# Patient Record
Sex: Female | Born: 1965
Health system: Southern US, Community
[De-identification: ages and names within clinical notes are randomized; demographics above are authoritative.]

## PROBLEM LIST (undated history)

## (undated) DIAGNOSIS — E785 Hyperlipidemia, unspecified: Secondary | ICD-10-CM

## (undated) DIAGNOSIS — I10 Essential (primary) hypertension: Secondary | ICD-10-CM

## (undated) DIAGNOSIS — Z9889 Other specified postprocedural states: Secondary | ICD-10-CM

## (undated) DIAGNOSIS — R112 Nausea with vomiting, unspecified: Secondary | ICD-10-CM

## (undated) DIAGNOSIS — F419 Anxiety disorder, unspecified: Secondary | ICD-10-CM

## (undated) HISTORY — PX: TONSILECTOMY, ADENOIDECTOMY, BILATERAL MYRINGOTOMY AND TUBES: SHX2538

## (undated) HISTORY — PX: ABDOMINAL HYSTERECTOMY: SHX81

## (undated) HISTORY — PX: TONSILLECTOMY: SUR1361

## (undated) HISTORY — PX: SINUS SURGERY WITH INSTATRAK: SHX5215

## (undated) HISTORY — PX: OTHER SURGICAL HISTORY: SHX169

## (undated) HISTORY — DX: Hyperlipidemia, unspecified: E78.5

---

## 1999-07-19 ENCOUNTER — Ambulatory Visit (HOSPITAL_BASED_OUTPATIENT_CLINIC_OR_DEPARTMENT_OTHER): Admission: RE | Admit: 1999-07-19 | Discharge: 1999-07-20 | Payer: Self-pay | Admitting: Otolaryngology

## 2000-07-07 ENCOUNTER — Encounter: Payer: Self-pay | Admitting: Internal Medicine

## 2000-07-07 ENCOUNTER — Ambulatory Visit (HOSPITAL_COMMUNITY): Admission: RE | Admit: 2000-07-07 | Discharge: 2000-07-07 | Payer: Self-pay | Admitting: Internal Medicine

## 2003-11-22 ENCOUNTER — Emergency Department (HOSPITAL_COMMUNITY): Admission: EM | Admit: 2003-11-22 | Discharge: 2003-11-22 | Payer: Self-pay | Admitting: Emergency Medicine

## 2004-01-14 ENCOUNTER — Emergency Department (HOSPITAL_COMMUNITY): Admission: EM | Admit: 2004-01-14 | Discharge: 2004-01-14 | Payer: Self-pay | Admitting: Emergency Medicine

## 2004-04-11 ENCOUNTER — Emergency Department (HOSPITAL_COMMUNITY): Admission: EM | Admit: 2004-04-11 | Discharge: 2004-04-11 | Payer: Self-pay | Admitting: Emergency Medicine

## 2005-07-30 ENCOUNTER — Emergency Department (HOSPITAL_COMMUNITY): Admission: EM | Admit: 2005-07-30 | Discharge: 2005-07-30 | Payer: Self-pay | Admitting: Emergency Medicine

## 2006-07-11 ENCOUNTER — Encounter (INDEPENDENT_AMBULATORY_CARE_PROVIDER_SITE_OTHER): Payer: Self-pay | Admitting: Family Medicine

## 2006-07-11 ENCOUNTER — Other Ambulatory Visit: Admission: RE | Admit: 2006-07-11 | Discharge: 2006-07-11 | Payer: Self-pay | Admitting: Family Medicine

## 2007-01-19 ENCOUNTER — Ambulatory Visit (HOSPITAL_COMMUNITY): Admission: RE | Admit: 2007-01-19 | Discharge: 2007-01-19 | Payer: Self-pay | Admitting: Family Medicine

## 2009-09-03 ENCOUNTER — Emergency Department (HOSPITAL_COMMUNITY): Admission: EM | Admit: 2009-09-03 | Discharge: 2009-09-04 | Payer: Self-pay | Admitting: Emergency Medicine

## 2009-10-09 ENCOUNTER — Ambulatory Visit (HOSPITAL_COMMUNITY): Admission: RE | Admit: 2009-10-09 | Discharge: 2009-10-09 | Payer: Self-pay | Admitting: Family Medicine

## 2009-11-18 ENCOUNTER — Ambulatory Visit: Payer: Self-pay | Admitting: Cardiology

## 2009-11-18 ENCOUNTER — Inpatient Hospital Stay (HOSPITAL_COMMUNITY): Admission: EM | Admit: 2009-11-18 | Discharge: 2009-11-20 | Payer: Self-pay | Admitting: Emergency Medicine

## 2009-11-20 ENCOUNTER — Encounter (INDEPENDENT_AMBULATORY_CARE_PROVIDER_SITE_OTHER): Payer: Self-pay | Admitting: Internal Medicine

## 2010-04-04 LAB — DIFFERENTIAL
Basophils Absolute: 0 10*3/uL (ref 0.0–0.1)
Basophils Absolute: 0.1 10*3/uL (ref 0.0–0.1)
Basophils Relative: 1 % (ref 0–1)
Basophils Relative: 1 % (ref 0–1)
Basophils Relative: 1 % (ref 0–1)
Eosinophils Relative: 0 % (ref 0–5)
Monocytes Absolute: 0.5 10*3/uL (ref 0.1–1.0)
Monocytes Absolute: 0.7 10*3/uL (ref 0.1–1.0)
Monocytes Relative: 7 % (ref 3–12)
Monocytes Relative: 9 % (ref 3–12)
Neutro Abs: 2.1 10*3/uL (ref 1.7–7.7)
Neutro Abs: 4 10*3/uL (ref 1.7–7.7)
Neutro Abs: 5.9 10*3/uL (ref 1.7–7.7)
Neutrophils Relative %: 43 % (ref 43–77)

## 2010-04-04 LAB — LIPASE, BLOOD: Lipase: 24 U/L (ref 11–59)

## 2010-04-04 LAB — POCT CARDIAC MARKERS
CKMB, poc: <1 ng/mL — ABNORMAL LOW (ref 1.0–8.0)
Troponin i, poc: <0.05 ng/mL (ref 0.00–0.09)

## 2010-04-04 LAB — CARDIAC PANEL(CRET KIN+CKTOT+MB+TROPI)
CK, MB: 0.8 ng/mL (ref 0.3–4.0)
CK, MB: 0.9 ng/mL (ref 0.3–4.0)
Relative Index: 0.8 (ref 0.0–2.5)
Relative Index: 0.8 (ref 0.0–2.5)
Total CK: 102 U/L (ref 7–177)
Total CK: 111 U/L (ref 7–177)
Troponin I: 0.02 ng/mL (ref 0.00–0.06)

## 2010-04-04 LAB — CBC
HCT: 39.8 % (ref 36.0–46.0)
Hemoglobin: 10.4 g/dL — ABNORMAL LOW (ref 12.0–15.0)
Hemoglobin: 13.3 g/dL (ref 12.0–15.0)
MCH: 29.1 pg (ref 26.0–34.0)
MCHC: 33.5 g/dL (ref 30.0–36.0)
MCV: 87.2 fL (ref 78.0–100.0)
MCV: 87.8 fL (ref 78.0–100.0)
Platelets: 211 10*3/uL (ref 150–400)
Platelets: 274 10*3/uL (ref 150–400)
RBC: 4.57 MIL/uL (ref 3.87–5.11)
RDW: 14.8 % (ref 11.5–15.5)
WBC: 4.8 10*3/uL (ref 4.0–10.5)
WBC: 6.4 10*3/uL (ref 4.0–10.5)
WBC: 8.7 10*3/uL (ref 4.0–10.5)

## 2010-04-04 LAB — D-DIMER, QUANTITATIVE: D-Dimer, Quant: 0.75 ug/mL-FEU — ABNORMAL HIGH (ref 0.00–0.48)

## 2010-04-04 LAB — COMPREHENSIVE METABOLIC PANEL
Albumin: 3.1 g/dL — ABNORMAL LOW (ref 3.5–5.2)
Albumin: 4.1 g/dL (ref 3.5–5.2)
Alkaline Phosphatase: 27 U/L — ABNORMAL LOW (ref 39–117)
Alkaline Phosphatase: 37 U/L — ABNORMAL LOW (ref 39–117)
BUN: 11 mg/dL (ref 6–23)
BUN: 17 mg/dL (ref 6–23)
CO2: 21 mEq/L (ref 19–32)
Chloride: 102 mEq/L (ref 96–112)
GFR calc non Af Amer: 32 mL/min — ABNORMAL LOW (ref >60)
Potassium: 3 mEq/L — ABNORMAL LOW (ref 3.5–5.1)
Potassium: 3.4 mEq/L — ABNORMAL LOW (ref 3.5–5.1)
Sodium: 138 mEq/L (ref 135–145)
Total Bilirubin: 0.8 mg/dL (ref 0.3–1.2)
Total Protein: 5.5 g/dL — ABNORMAL LOW (ref 6.0–8.3)

## 2010-04-04 LAB — RAPID URINE DRUG SCREEN, HOSP PERFORMED
Amphetamines: NOT DETECTED
Barbiturates: NOT DETECTED
Benzodiazepines: POSITIVE — AB
Cocaine: NOT DETECTED
Opiates: NOT DETECTED

## 2010-04-04 LAB — MAGNESIUM: Magnesium: 1.8 mg/dL (ref 1.5–2.5)

## 2010-04-04 LAB — BASIC METABOLIC PANEL
Calcium: 8.1 mg/dL — ABNORMAL LOW (ref 8.4–10.5)
GFR calc Af Amer: >60 mL/min (ref >60)
GFR calc non Af Amer: 51 mL/min — ABNORMAL LOW (ref >60)
Glucose, Bld: 82 mg/dL (ref 70–99)
Sodium: 141 mEq/L (ref 135–145)

## 2010-04-04 LAB — CORTISOL: Cortisol, Plasma: 5.1 ug/dL

## 2010-04-04 LAB — URINALYSIS, ROUTINE W REFLEX MICROSCOPIC
Bilirubin Urine: NEGATIVE
Ketones, ur: NEGATIVE mg/dL
Ketones, ur: NEGATIVE mg/dL
Nitrite: NEGATIVE
Protein, ur: NEGATIVE mg/dL
Protein, ur: NEGATIVE mg/dL
Urobilinogen, UA: 0.2 mg/dL (ref 0.0–1.0)
Urobilinogen, UA: 0.2 mg/dL (ref 0.0–1.0)

## 2010-04-04 LAB — PHOSPHORUS: Phosphorus: 3 mg/dL (ref 2.3–4.6)

## 2010-04-04 LAB — URINE MICROSCOPIC-ADD ON

## 2010-04-04 LAB — TSH: TSH: 2.205 u[IU]/mL (ref 0.350–4.500)

## 2010-10-22 ENCOUNTER — Other Ambulatory Visit (HOSPITAL_COMMUNITY): Payer: Self-pay | Admitting: Family Medicine

## 2010-10-22 DIAGNOSIS — Z139 Encounter for screening, unspecified: Secondary | ICD-10-CM

## 2010-10-29 ENCOUNTER — Ambulatory Visit (HOSPITAL_COMMUNITY)
Admission: RE | Admit: 2010-10-29 | Discharge: 2010-10-29 | Disposition: A | Discharge status: Home / Self Care | Payer: 59 | Source: Ambulatory Visit | Attending: Family Medicine | Admitting: Family Medicine

## 2010-10-29 DIAGNOSIS — Z139 Encounter for screening, unspecified: Secondary | ICD-10-CM

## 2010-10-29 DIAGNOSIS — Z1231 Encounter for screening mammogram for malignant neoplasm of breast: Secondary | ICD-10-CM | POA: Insufficient documentation

## 2011-01-22 IMAGING — CR DG ABDOMEN ACUTE W/ 1V CHEST
3 series · 3 of 3 positions shown · non-contrast
Comparison: None

CLINICAL DATA: Vomiting.  Nausea.  Dizziness.

ACUTE ABDOMEN SERIES (ABDOMEN 2 VIEW & CHEST 1 VIEW)

[view not recorded (1 of 3)]
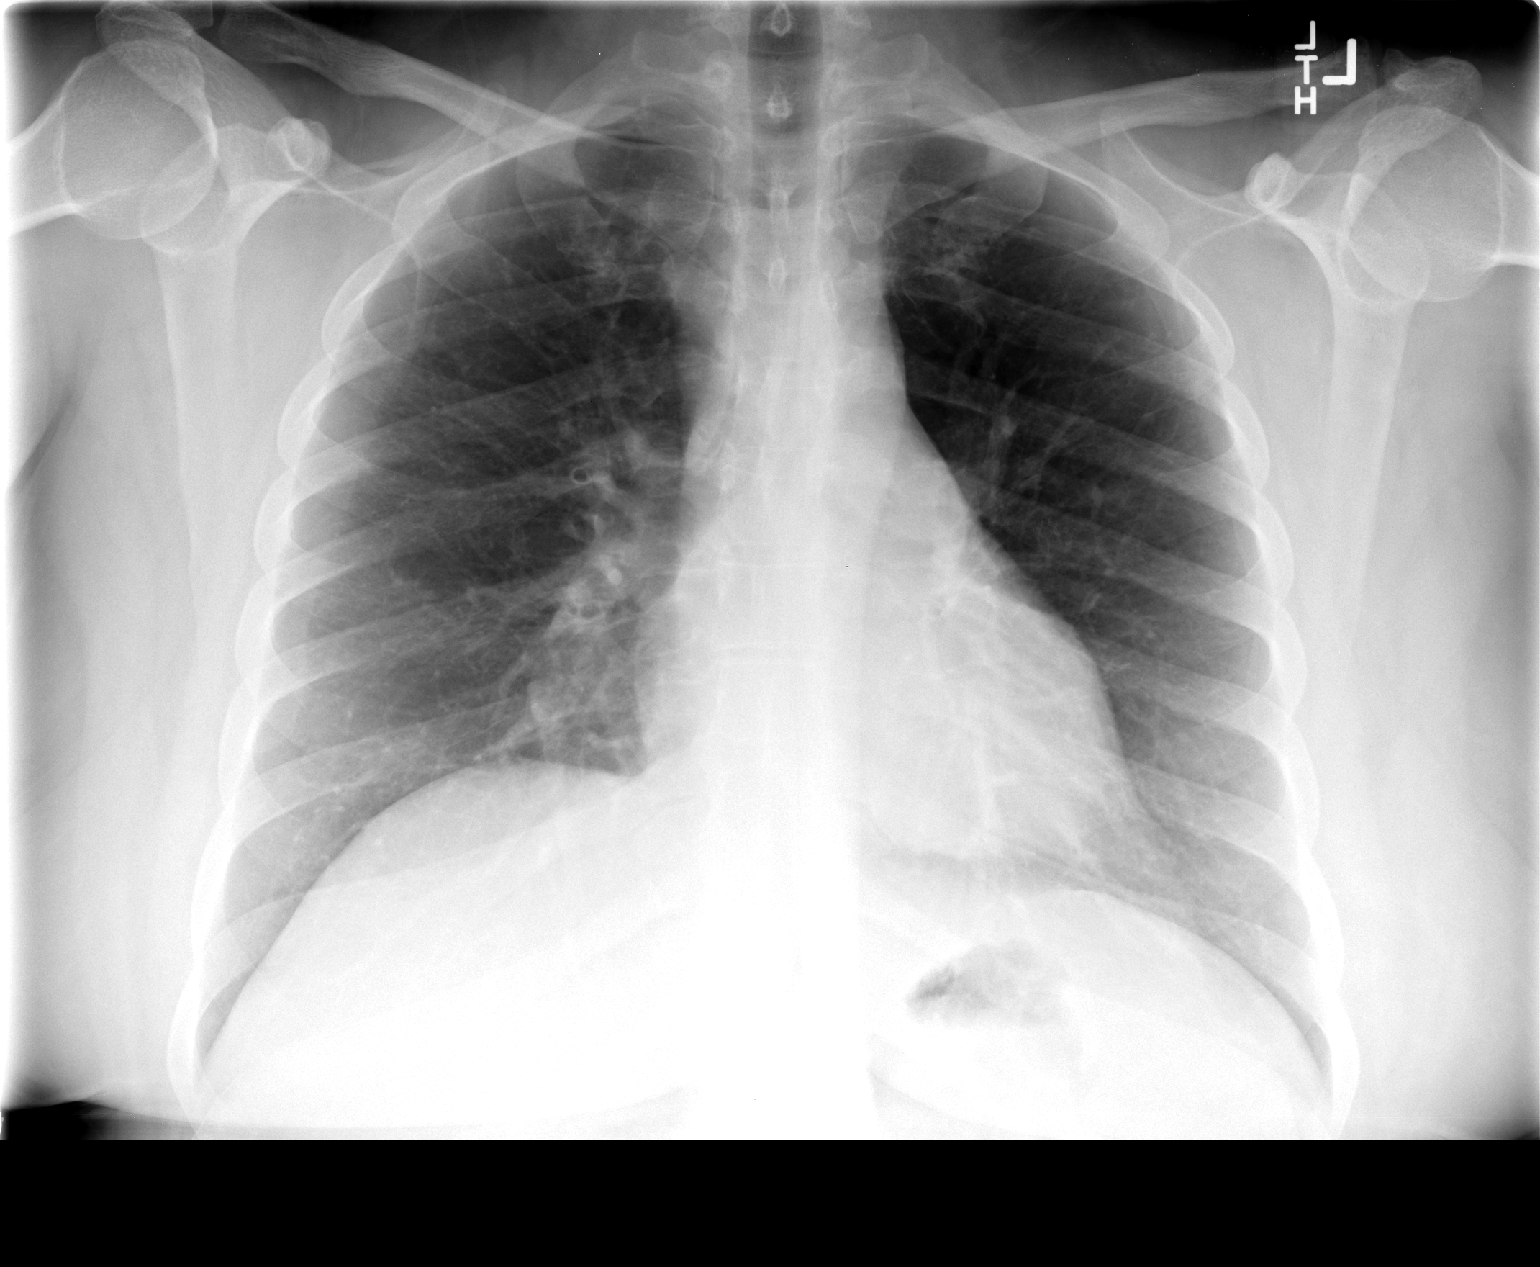

[view not recorded (2 of 3)]
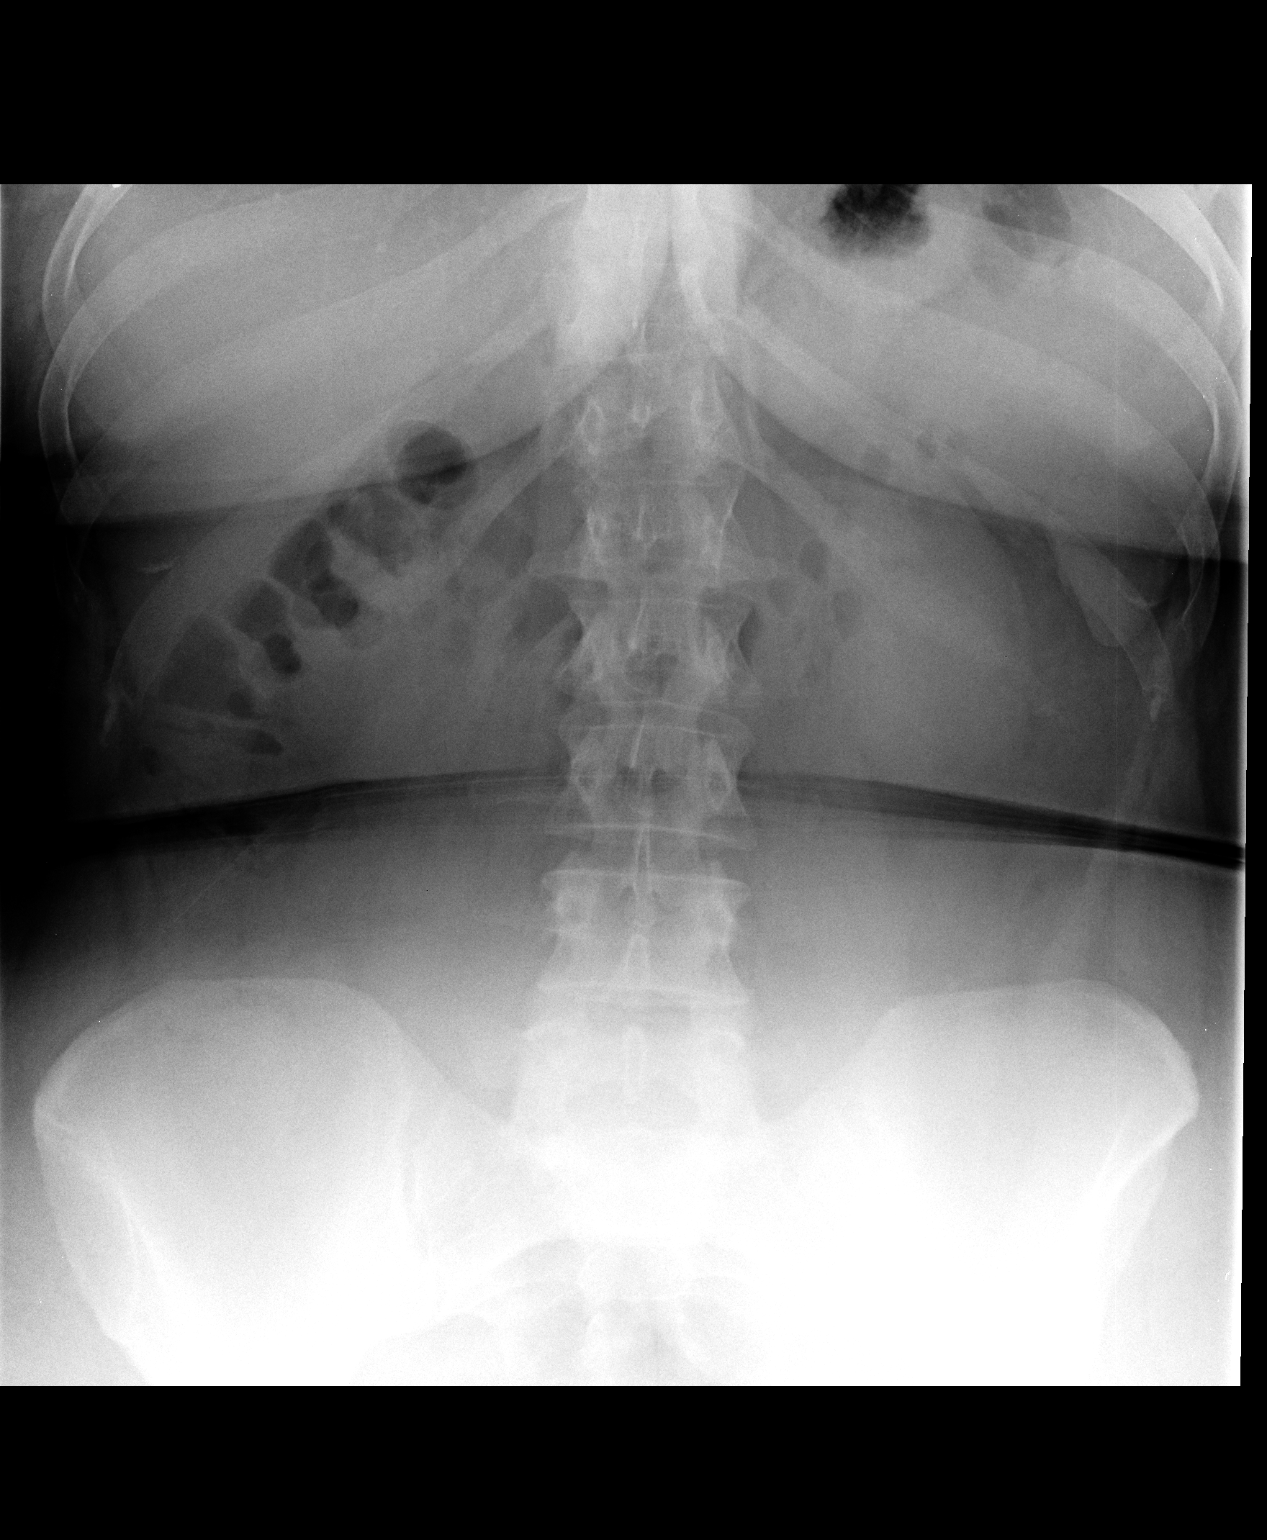

[view not recorded (3 of 3)]
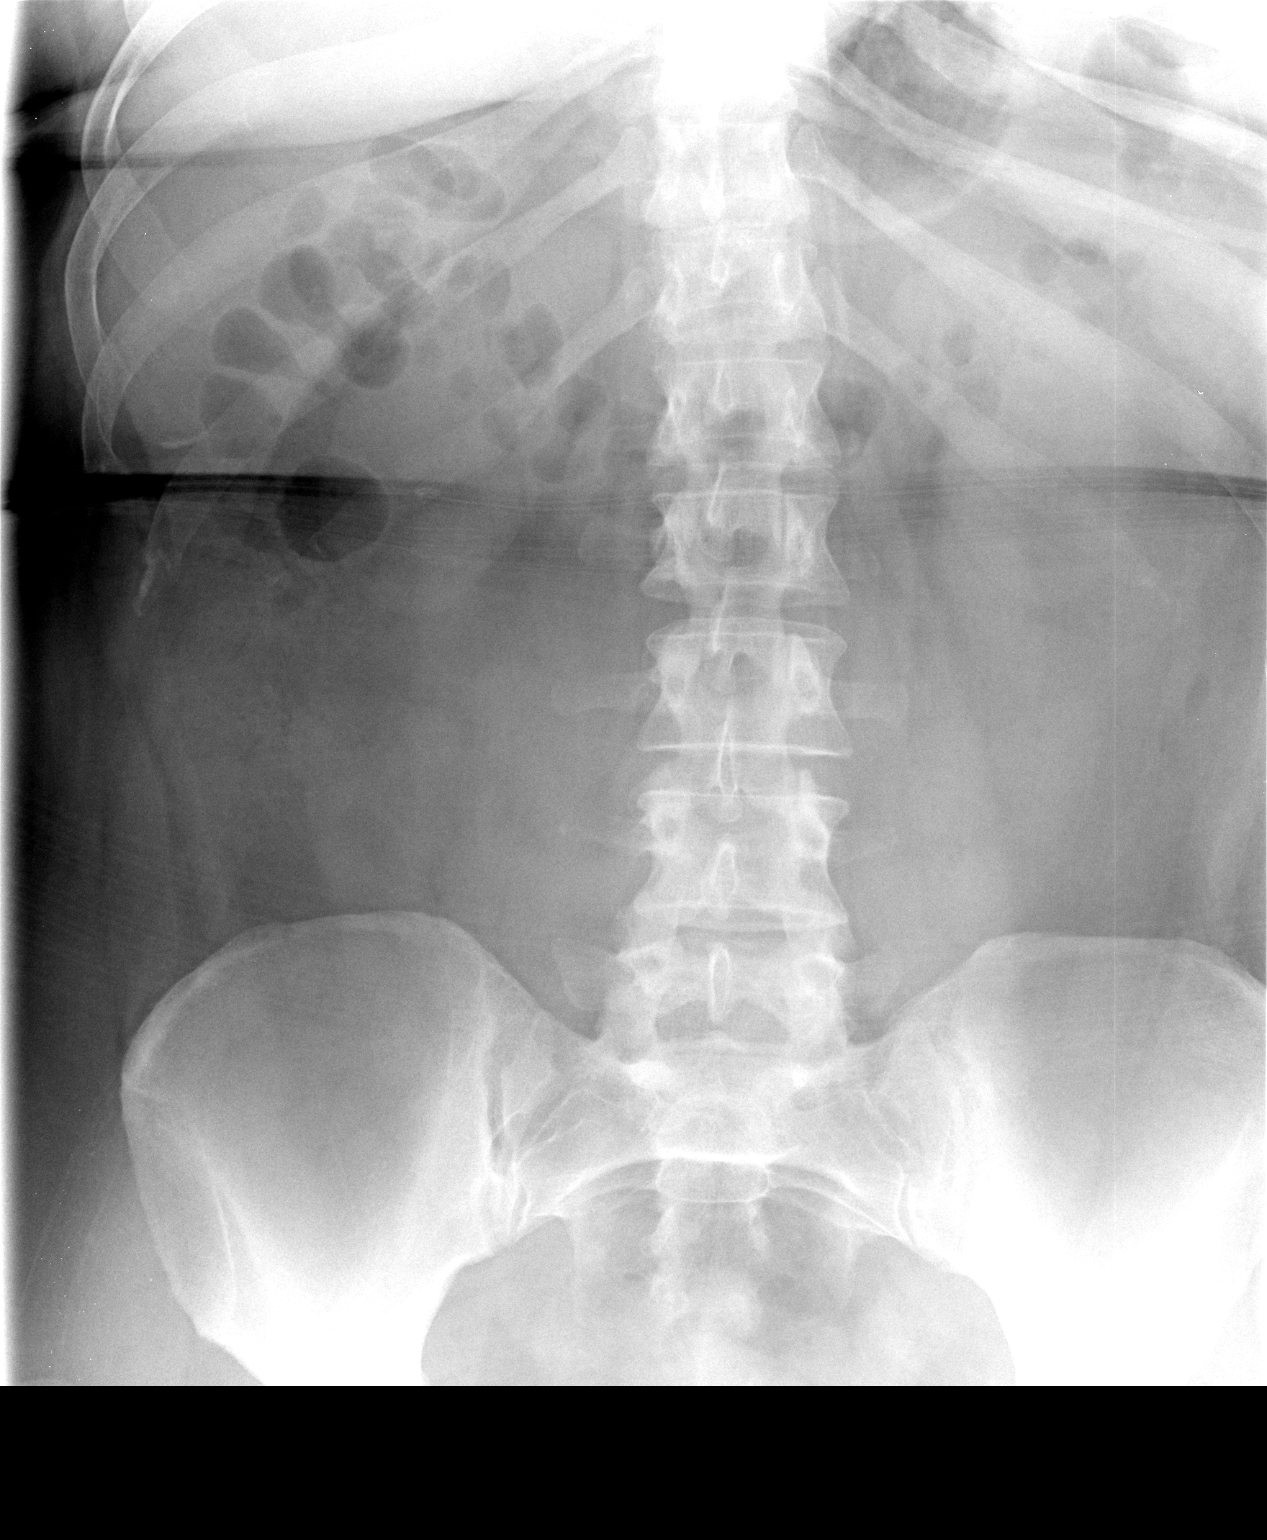

[3 of 3 positions shown; findings below may reference images not displayed]

FINDINGS: Bowel gas pattern is normal without evidence of ileus,
obstruction or free air.  No worrisome calcifications or bony
findings.

One-view chest shows normal heart and mediastinal shadows.  The
lungs are clear.  No effusions.  No free air.
IMPRESSION: Negative acute abdominal series.

## 2011-04-22 ENCOUNTER — Emergency Department (HOSPITAL_COMMUNITY)
Admission: EM | Admit: 2011-04-22 | Discharge: 2011-04-22 | Disposition: A | Discharge status: Home / Self Care | Payer: 59 | Attending: Emergency Medicine | Admitting: Emergency Medicine

## 2011-04-22 ENCOUNTER — Encounter (HOSPITAL_COMMUNITY): Payer: Self-pay

## 2011-04-22 DIAGNOSIS — H53149 Visual discomfort, unspecified: Secondary | ICD-10-CM | POA: Insufficient documentation

## 2011-04-22 DIAGNOSIS — R110 Nausea: Secondary | ICD-10-CM | POA: Insufficient documentation

## 2011-04-22 DIAGNOSIS — R51 Headache: Secondary | ICD-10-CM

## 2011-04-22 HISTORY — DX: Essential (primary) hypertension: I10

## 2011-04-22 MED ORDER — KETOROLAC TROMETHAMINE 30 MG/ML IJ SOLN
30.0000 mg | Freq: Once | INTRAMUSCULAR | Status: AC
  Administered 2011-04-22: 30 mg via INTRAVENOUS
  Filled 2011-04-22: qty 1

## 2011-04-22 MED ORDER — METOCLOPRAMIDE HCL 5 MG/ML IJ SOLN
10.0000 mg | Freq: Once | INTRAMUSCULAR | Status: AC
  Administered 2011-04-22: 10 mg via INTRAVENOUS
  Filled 2011-04-22: qty 2

## 2011-04-22 NOTE — ED Provider Notes (Signed)
History  This chart was scribed for Benny Lennert, MD by Bennett Scrape. This patient was seen in room [facility]19/[facility]19 and the patient's care was started at 9:40PM.  CSN: [id number]  Arrival date & time 04/22/11  4098   First MD Initiated Contact with Patient 04/22/11 2057      Chief Complaint  Patient presents with  . Migraine    Patient is a 46 y.o. female presenting with migraine. The history is provided by the patient. No language interpreter was used.  Migraine This is a new problem. The current episode started more than 2 days ago. The problem occurs constantly. The problem has not changed since onset.Associated symptoms include headaches. Pertinent negatives include no chest pain, no abdominal pain and no shortness of breath.    Brittany Daniels is a 46 y.o. female with a h/o migraine who presents to the Emergency Department complaining of 3 days of gradual onset, gradually worsening, constant HA that pt states is a migraine. The HA is located along the right temple with radiation into the back of the neck. The pain is described as a pulsing sensation. She reports that the symptoms started 3 days ago but didn't reach full effect until this morning. She lists photophobia and nausea as associated symptoms. She reports taking tylenol, ib profen and hydrocodone for her symptoms with no improvement. She denies blurred vision, fever, chills and emesis as associated symptoms. Pt also has a h/o HTN. She denies smoking and alcohol use.  Pt's PCP is Dr. Genia Hotter.   Past Medical History  Diagnosis Date  . Hypertension   . Migraine     History reviewed. No pertinent past surgical history.  No family history on file.  History  Substance Use Topics  . Smoking status: Never Smoker   . Smokeless tobacco: Not on file  . Alcohol Use: No    Review of Systems  Constitutional: Negative for fatigue.  HENT: Negative for congestion, sinus pressure and ear discharge.   Eyes: Positive for  photophobia. Negative for discharge.  Respiratory: Negative for cough and shortness of breath.   Cardiovascular: Negative for chest pain.  Gastrointestinal: Positive for nausea. Negative for vomiting, abdominal pain and diarrhea.  Genitourinary: Negative for frequency and hematuria.  Musculoskeletal: Negative for back pain.  Skin: Negative for rash.  Neurological: Positive for headaches. Negative for seizures.  Hematological: Negative.   Psychiatric/Behavioral: Negative for hallucinations.    Allergies  Oxycontin  Home Medications   Current Outpatient Rx  Name Route Sig Dispense Refill  . ACETAMINOPHEN 500 MG PO TABS Oral Take 500 mg by mouth every 6 (six) hours as needed.    Marland Kitchen VITAMIN D 1000 UNITS PO TABS Oral Take 1,000 Units by mouth daily.    . OMEGA-3 FATTY ACIDS 1000 MG PO CAPS Oral Take 2 g by mouth daily.    . IBUPROFEN 200 MG PO TABS Oral Take 400 mg by mouth every 6 (six) hours as needed.    Marland Kitchen OLMESARTAN MEDOXOMIL-HCTZ 20-12.5 MG PO TABS Oral Take 1 tablet by mouth daily.      Triage Vitals: BP 133/96  Pulse 105  Temp(Src) 99 F (37.2 C) (Oral)  Resp 16  Ht 5\' 4"  (1.626 m)  Wt 235 lb (106.595 kg)  BMI 40.34 kg/m2  SpO2 100%  LMP 04/17/2011  Physical Exam  Nursing note and vitals reviewed. Constitutional: She is oriented to person, place, and time. She appears well-developed.  HENT:  Head: Normocephalic and atraumatic.  Eyes: Conjunctivae  and EOM are normal. No scleral icterus.  Neck: Neck supple. No thyromegaly present.  Cardiovascular: Normal rate and regular rhythm.  Exam reveals no gallop and no friction rub.   No murmur heard. Pulmonary/Chest: No stridor. She has no wheezes. She has no rales. She exhibits no tenderness.  Abdominal: She exhibits no distension. There is no tenderness. There is no rebound.  Musculoskeletal: Normal range of motion. She exhibits no edema.  Lymphadenopathy:    She has no cervical adenopathy.  Neurological: She is oriented to  person, place, and time. Coordination normal.  Skin: No rash noted. No erythema.  Psychiatric: She has a normal mood and affect. Her behavior is normal.    ED Course  Procedures (including critical care time)  DIAGNOSTIC STUDIES: Oxygen Saturation is 100% on room air, normal by my interpretation.    COORDINATION OF CARE: 9:42PM-Pt feels better after medication and is ready to go home. Advised pt to follow up with pt if symptoms return.   Labs Reviewed - No data to display No results found.   No diagnosis found.    MDM        The chart was scribed for me under my direct supervision.  I personally performed the history, physical, and medical decision making and all procedures in the evaluation of this patient.Benny Lennert, MD 04/22/11 [phone]

## 2011-04-22 NOTE — ED Notes (Signed)
Hx of migraines, this one started approx 3 days ago with photophobia and nausea, no vomiting

## 2011-04-22 NOTE — Discharge Instructions (Signed)
Follow up with your md as needed °

## 2011-11-11 ENCOUNTER — Other Ambulatory Visit (HOSPITAL_COMMUNITY): Payer: Self-pay | Admitting: Family Medicine

## 2011-11-11 DIAGNOSIS — Z139 Encounter for screening, unspecified: Secondary | ICD-10-CM

## 2011-11-18 ENCOUNTER — Ambulatory Visit (HOSPITAL_COMMUNITY)
Admission: RE | Admit: 2011-11-18 | Discharge: 2011-11-18 | Disposition: A | Discharge status: Home / Self Care | Payer: 59 | Source: Ambulatory Visit | Attending: Family Medicine | Admitting: Family Medicine

## 2011-11-18 DIAGNOSIS — Z1231 Encounter for screening mammogram for malignant neoplasm of breast: Secondary | ICD-10-CM | POA: Insufficient documentation

## 2011-11-18 DIAGNOSIS — Z139 Encounter for screening, unspecified: Secondary | ICD-10-CM

## 2012-04-30 ENCOUNTER — Encounter: Payer: Self-pay | Admitting: *Deleted

## 2012-04-30 DIAGNOSIS — E785 Hyperlipidemia, unspecified: Secondary | ICD-10-CM

## 2012-04-30 DIAGNOSIS — F419 Anxiety disorder, unspecified: Secondary | ICD-10-CM

## 2012-04-30 DIAGNOSIS — O1002 Pre-existing essential hypertension complicating childbirth: Secondary | ICD-10-CM

## 2012-05-14 ENCOUNTER — Encounter: Payer: Self-pay | Admitting: Obstetrics & Gynecology

## 2012-05-14 ENCOUNTER — Ambulatory Visit (INDEPENDENT_AMBULATORY_CARE_PROVIDER_SITE_OTHER): Payer: 59 | Admitting: Obstetrics & Gynecology

## 2012-05-14 VITALS — BP 124/88 | Ht 64.0 in | Wt 214.0 lb

## 2012-05-14 DIAGNOSIS — N920 Excessive and frequent menstruation with regular cycle: Secondary | ICD-10-CM

## 2012-05-14 DIAGNOSIS — N946 Dysmenorrhea, unspecified: Secondary | ICD-10-CM

## 2012-05-14 MED ORDER — MEGESTROL ACETATE 40 MG PO TABS: 40.0000 mg | ORAL_TABLET | Freq: Every day | ORAL | Status: DC

## 2012-05-14 NOTE — Progress Notes (Signed)
Patient ID: Brittany Daniels, female   DOB: 05/09/1965, 47 y.o.   MRN: [id number] Over past 1 year bleeding has significantly increased Significant cramping calls in to work several times, never before Not evaluated No meds Blood pressure 124/88, height 5\' 4"  (1.626 m), weight 214 lb (97.07 kg), last menstrual period 04/21/2012.  Exam Vulva  nefg Vagina No blood cervix normal Pap done Uterus NSSC NT Adnexa    No masses non tender  ASSESSMENT: Menometrorrhagia Dysmenorrhea  PLAN: Megestrol pre operatively Sonogram 1 month Anticipate endometrial ablation

## 2012-05-14 NOTE — Patient Instructions (Addendum)
Menorrhagia   Menorrhagia is when a menstrual period is heavier or longer than normal.  HOME CARE   Only take medicine as told by your doctor.   Do not take aspirin 1 week before or during your period. Aspirin can make the bleeding worse.   Lay down for a while if you change your tampon or pad more than once in 2 hours. This may help lessen the bleeding.   Take any iron pills as told by your doctor. Heavy bleeding may cause you to lack iron in your body.   Eat a healthy diet and foods with iron. These foods include leafy green vegetables, meat, liver, eggs, and whole grain breads and cereals.   Eat foods that are high in vitamin C. These include oranges, orange juice, and grapefruits. Vitamin C can help your body take in more iron.   Do not try to lose weight. Wait until the heavy bleeding has stopped and your iron level is normal.  GET HELP RIGHT AWAY IF:   You get a fever.   You have trouble breathing.   You bleed even more heavily than usual and pass blood clots.   You feel dizzy, weak, or pass out (faint).   You need to change your tampon or pad more than once an hour.   You feel sick to your stomach (nauseous), throw up (vomit), or have watery poop (diarrhea).   You have problems from medicine.  MAKE SURE YOU:    Understand these instructions.   Will watch your condition.   Will get help right away if you are not doing well or get worse.  Document Released: 10/17/2007 Document Revised: 04/01/2011 Document Reviewed: 10/17/2007  [employer] Patient Information 2013 [employer].

## 2012-05-18 ENCOUNTER — Other Ambulatory Visit: Payer: Self-pay | Admitting: Obstetrics & Gynecology

## 2012-05-18 DIAGNOSIS — N949 Unspecified condition associated with female genital organs and menstrual cycle: Secondary | ICD-10-CM

## 2012-05-18 DIAGNOSIS — N938 Other specified abnormal uterine and vaginal bleeding: Secondary | ICD-10-CM

## 2012-06-04 ENCOUNTER — Encounter (HOSPITAL_COMMUNITY)
Admission: RE | Admit: 2012-06-04 | Discharge: 2012-06-04 | Disposition: A | Discharge status: Home / Self Care | Payer: 59 | Source: Ambulatory Visit | Attending: Obstetrics & Gynecology | Admitting: Obstetrics & Gynecology

## 2012-06-04 ENCOUNTER — Ambulatory Visit (INDEPENDENT_AMBULATORY_CARE_PROVIDER_SITE_OTHER): Payer: 59

## 2012-06-04 ENCOUNTER — Ambulatory Visit (INDEPENDENT_AMBULATORY_CARE_PROVIDER_SITE_OTHER): Payer: 59 | Admitting: Obstetrics & Gynecology

## 2012-06-04 ENCOUNTER — Other Ambulatory Visit: Payer: Self-pay | Admitting: Obstetrics & Gynecology

## 2012-06-04 ENCOUNTER — Encounter: Payer: Self-pay | Admitting: Obstetrics & Gynecology

## 2012-06-04 ENCOUNTER — Encounter (HOSPITAL_COMMUNITY): Payer: Self-pay

## 2012-06-04 VITALS — BP 90/60 | Wt 206.0 lb

## 2012-06-04 DIAGNOSIS — D259 Leiomyoma of uterus, unspecified: Secondary | ICD-10-CM

## 2012-06-04 DIAGNOSIS — N920 Excessive and frequent menstruation with regular cycle: Secondary | ICD-10-CM

## 2012-06-04 DIAGNOSIS — N949 Unspecified condition associated with female genital organs and menstrual cycle: Secondary | ICD-10-CM

## 2012-06-04 DIAGNOSIS — N946 Dysmenorrhea, unspecified: Secondary | ICD-10-CM

## 2012-06-04 DIAGNOSIS — N938 Other specified abnormal uterine and vaginal bleeding: Secondary | ICD-10-CM

## 2012-06-04 DIAGNOSIS — N9489 Other specified conditions associated with female genital organs and menstrual cycle: Secondary | ICD-10-CM

## 2012-06-04 DIAGNOSIS — D252 Subserosal leiomyoma of uterus: Secondary | ICD-10-CM

## 2012-06-04 HISTORY — DX: Other specified postprocedural states: Z98.890

## 2012-06-04 HISTORY — DX: Anxiety disorder, unspecified: F41.9

## 2012-06-04 HISTORY — DX: Nausea with vomiting, unspecified: R11.2

## 2012-06-04 LAB — COMPREHENSIVE METABOLIC PANEL
ALT: 13 U/L (ref 0–35)
AST: 15 U/L (ref 0–37)
Albumin: 3.9 g/dL (ref 3.5–5.2)
Alkaline Phosphatase: 34 U/L — ABNORMAL LOW (ref 39–117)
BUN: 17 mg/dL (ref 6–23)
Chloride: 102 mEq/L (ref 96–112)
Potassium: 3.8 mEq/L (ref 3.5–5.1)
Sodium: 140 mEq/L (ref 135–145)
Total Bilirubin: 1 mg/dL (ref 0.3–1.2)
Total Protein: 6.9 g/dL (ref 6.0–8.3)

## 2012-06-04 LAB — SURGICAL PCR SCREEN
MRSA, PCR: NEGATIVE
Staphylococcus aureus: POSITIVE — AB

## 2012-06-04 LAB — CBC
HCT: 40.9 % (ref 36.0–46.0)
Platelets: 297 10*3/uL (ref 150–400)
RDW: 13.8 % (ref 11.5–15.5)
WBC: 6.8 10*3/uL (ref 4.0–10.5)

## 2012-06-04 NOTE — Progress Notes (Signed)
Patient ID: Brittany Daniels, female   DOB: [birth year], 47 y.o.   MRN: [id number] Preoperative History and Physical  Brittany Daniels is a 47 y.o. G2P2. with Patient's last menstrual period was 04/21/2012. admitted for a hysteroscopy uterine curettage endometrial ablation and removal of submucosal myoma.  Responded to megace well no bleeding, does have a1,6 cm submucosal myoma which we will remove.  PMH:    Past Medical History  Diagnosis Date  . Hypertension   . Migraine   . Hyperlipidemia     PSH:     Past Surgical History  Procedure Laterality Date  . Sinus surgery with instatrak    . Tonsilectomy, adenoidectomy, bilateral myringotomy and tubes    . Molar abstraction      POb/GynH:     G2P2 OB History   Grav Para Term Preterm Abortions TAB SAB Ect Mult Living                  SH:   History  Substance Use Topics  . Smoking status: Never Smoker   . Smokeless tobacco: Not on file  . Alcohol Use: No    FH:    Family History  Problem Relation Age of Onset  . Hypertension Mother   . Diabetes Father   . Cancer Maternal Grandmother     breast cancer  . Cancer Maternal Grandfather     skin cancer     Allergies:  Allergies  Allergen Reactions  . Ace Inhibitors Cough  . Oxycodone Hcl Er Itching    Medications:      Current outpatient prescriptions:ALPRAZolam (XANAX) 0.5 MG tablet, Take 0.5 mg by mouth 3 (three) times daily., Disp: , Rfl: ;  cholecalciferol (VITAMIN D) 1000 UNITS tablet, Take 1,000 Units by mouth daily., Disp: , Rfl: ;  fish oil-omega-3 fatty acids 1000 MG capsule, Take 2 g by mouth daily., Disp: , Rfl: ;  ibuprofen (ADVIL,MOTRIN) 200 MG tablet, Take 400 mg by mouth every 6 (six) hours as needed., Disp: , Rfl:  megestrol (MEGACE) 40 MG tablet, Take 1 tablet (40 mg total) by mouth daily., Disp: 30 tablet, Rfl: 2;  olmesartan-hydrochlorothiazide (BENICAR HCT) 40-25 MG per tablet, Take 1 tablet by mouth daily., Disp: , Rfl: ;  acetaminophen (TYLENOL) 500 MG  tablet, Take 500 mg by mouth every 6 (six) hours as needed., Disp: , Rfl: ;  rOPINIRole (REQUIP) 0.5 MG tablet, Take 0.5 mg by mouth at bedtime., Disp: , Rfl:   Review of Systems:   Review of Systems  Constitutional: Negative for fever, chills, weight loss, malaise/fatigue and diaphoresis.  HENT: Negative for hearing loss, ear pain, nosebleeds, congestion, sore throat, neck pain, tinnitus and ear discharge.   Eyes: Negative for blurred vision, double vision, photophobia, pain, discharge and redness.  Respiratory: Negative for cough, hemoptysis, sputum production, shortness of breath, wheezing and stridor.   Cardiovascular: Negative for chest pain, palpitations, orthopnea, claudication, leg swelling and PND.  Gastrointestinal: negative for abdominal pain. Negative for heartburn, nausea, vomiting, diarrhea, constipation, blood in stool and melena.  Genitourinary: Negative for dysuria, urgency, frequency, hematuria and flank pain.  Musculoskeletal: Negative for myalgias, back pain, joint pain and falls.  Skin: Negative for itching and rash.  Neurological: Negative for dizziness, tingling, tremors, sensory change, speech change, focal weakness, seizures, loss of consciousness, weakness and headaches.  Endo/Heme/Allergies: Negative for environmental allergies and polydipsia. Does not bruise/bleed easily.  Psychiatric/Behavioral: Negative for depression, suicidal ideas, hallucinations, memory loss and substance abuse. The patient is not  nervous/anxious and does not have insomnia.      PHYSICAL EXAM:  Blood pressure 90/60, weight 206 lb (93.441 kg), last menstrual period 04/21/2012.    Vitals reviewed. Constitutional: She is oriented to person, place, and time. She appears well-developed and well-nourished.  HENT:  Head: Normocephalic and atraumatic.  Right Ear: External ear normal.  Left Ear: External ear normal.  Nose: Nose normal.  Mouth/Throat: Oropharynx is clear and moist.  Eyes:  Conjunctivae and EOM are normal. Pupils are equal, round, and reactive to light. Right eye exhibits no discharge. Left eye exhibits no discharge. No scleral icterus.  Neck: Normal range of motion. Neck supple. No tracheal deviation present. No thyromegaly present.  Cardiovascular: Normal rate, regular rhythm, normal heart sounds and intact distal pulses.  Exam reveals no gallop and no friction rub.   No murmur heard. Respiratory: Effort normal and breath sounds normal. No respiratory distress. She has no wheezes. She has no rales. She exhibits no tenderness.  GI: Soft. Bowel sounds are normal. She exhibits no distension and no mass. There is tenderness. There is no rebound and no guarding.  Genitourinary:       Vulva is normal without lesions Vagina is pink moist without discharge Cervix normal in appearance and pap is normal Uterus is normal size sonogram reveals multiple small myomsa Adnexa is negative with normal sized ovaries by sonogram  Musculoskeletal: Normal range of motion. She exhibits no edema and no tenderness.  Neurological: She is alert and oriented to person, place, and time. She has normal reflexes. She displays normal reflexes. No cranial nerve deficit. She exhibits normal muscle tone. Coordination normal.  Skin: Skin is warm and dry. No rash noted. No erythema. No pallor.  Psychiatric: She has a normal mood and affect. Her behavior is normal. Judgment and thought content normal.    Labs:   EKG:   Imaging Studies:     Assessment: Menometrorrhagia Dysmenorrhea Submucosal myoma Patient Active Problem List   Diagnosis Date Noted  . Hyperlipidemia 04/30/2012  . Anxiety 04/30/2012  . Benign essential hypertension with delivery 04/30/2012    Plan: Hysteroscopy uterine curettage endometrial ablation and removal of submucosal myoma by laser  Brittany Daniels 06/04/2012 2:24 PM

## 2012-06-04 NOTE — Patient Instructions (Addendum)
CEAIRA ERNSTER  06/04/2012   Your procedure is scheduled on:  06/10/2012  Report to [facility] at  850  AM.  Call this number if you have problems the morning of surgery: [phone]   Remember:   Do not eat food or drink liquids after midnight.   Take these medicines the morning of surgery with A SIP OF WATER: xanax, benicar   Do not wear jewelry, make-up or nail polish.  Do not wear lotions, powders, or perfumes.   Do not shave 48 hours prior to surgery. Men may shave face and neck.  Do not bring valuables to the hospital.  Contacts, dentures or bridgework may not be worn into surgery.  Leave suitcase in the car. After surgery it may be brought to your room.  For patients admitted to the hospital, checkout time is 11:00 AM the day of discharge.   Patients discharged the day of surgery will not be allowed to drive  home.  Name and phone number of your driver: family  Special Instructions: Shower using CHG 2 nights before surgery and the night before surgery.  If you shower the day of surgery use CHG.  Use special wash - you have one bottle of CHG for all showers.  You should use approximately 1/3 of the bottle for each shower.   Please read over the following fact sheets that you were given: Pain Booklet, Coughing and Deep Breathing, MRSA Information, Surgical Site Infection Prevention, Anesthesia Post-op Instructions and Care and Recovery After Surgery Endometrial Ablation Endometrial ablation removes the lining of the uterus (endometrium). It is usually a same day, outpatient treatment. Ablation helps avoid major surgery (such as a hysterectomy). A hysterectomy is removal of the cervix and uterus. Endometrial ablation has less risk and complications, has a shorter recovery period and is less expensive. After endometrial ablation, most women will have little or no menstrual bleeding. You may not keep your fertility. Pregnancy is no longer likely after this procedure but if you are  pre-menopausal, you still need to use a reliable method of birth control following the procedure because pregnancy can occur. REASONS TO HAVE THE PROCEDURE MAY INCLUDE:  Heavy periods.  Bleeding that is causing anemia.  Anovulatory bleeding, very irregular, bleeding.  Bleeding submucous fibroids (on the lining inside the uterus) if they are smaller than 3 centimeters. REASONS NOT TO HAVE THE PROCEDURE MAY INCLUDE:  You wish to have more children.  You have a pre-cancerous or cancerous problem. The cause of any abnormal bleeding must be diagnosed before having the procedure.  You have pain coming from the uterus.  You have a submucus fibroid larger than 3 centimeters.  You recently had a baby.  You recently had an infection in the uterus.  You have a severe retro-flexed, tipped uterus and cannot insert the instrument to do the ablation.  You had a Cesarean section or deep major surgery on the uterus.  The inner cavity of the uterus is too large for the endometrial ablation instrument. RISKS AND COMPLICATIONS   Perforation of the uterus.  Bleeding.  Infection of the uterus, bladder or vagina.  Injury to surrounding organs.  Cutting the cervix.  An air bubble to the lung (air embolus).  Pregnancy following the procedure.  Failure of the procedure to help the problem requiring hysterectomy.  Decreased ability to diagnose cancer in the lining of the uterus. BEFORE THE PROCEDURE  The lining of the uterus must be tested  to make sure there is no pre-cancerous or cancer cells present.  Medications may be given to make the lining of the uterus thinner.  Ultrasound may be used to evaluate the size and look for abnormalities of the uterus.  Future pregnancy is not desired. PROCEDURE  There are different ways to destroy the lining of the uterus.   Resectoscope - radio frequency-alternating electric current is the most common one used.  Cryotherapy - freezing the  lining of the uterus.  Heated Free Liquid - heated salt (saline) solution inserted into the uterus.  Microwave - uses high energy microwaves in the uterus.  Thermal Balloon - a catheter with a balloon tip is inserted into the uterus and filled with heated fluid. Your caregiver will talk with you about the method used in this clinic. They will also instruct you on the pros and cons of the procedure. Endometrial ablation is performed along with a procedure called operative hysteroscopy. A narrow viewing tube is inserted through the birth canal (vagina) and through the cervix into the uterus. A tiny camera attached to the viewing tube (hysteroscope) allows the uterine cavity to be shown on a TV monitor during surgery. Your uterus is filled with a harmless liquid to make the procedure easier. The lining of the uterus is then removed. The lining can also be removed with a resectoscope which allows your surgeon to cut away the lining of the uterus under direct vision. Usually, you will be able to go home within an hour after the procedure. HOME CARE INSTRUCTIONS   Do not drive for 24 hours.  No tampons, douching or intercourse for 2 weeks or until your caregiver approves.  Rest at home for 24 to 48 hours. You may then resume normal activities unless told differently by your caregiver.  Take your temperature two times a day for 4 days, and record it.  Take any medications your caregiver has ordered, as directed.  Use some form of contraception if you are pre-menopausal and do not want to get pregnant. Bleeding after the procedure is normal. It varies from light spotting and mildly watery to bloody discharge for 4 to 6 weeks. You may also have mild cramping. Only take over-the-counter or prescription medicines for pain, discomfort, or fever as directed by your caregiver. Do not use aspirin, as this may aggravate bleeding. Frequent urination during the first 24 hours is normal. You will not know how  effective your surgery is until at least 3 months after the surgery. SEEK IMMEDIATE MEDICAL CARE IF:   Bleeding is heavier than a normal menstrual cycle.  An oral temperature above 102 F (38.9 C) develops.  You have increasing cramps or pains not relieved with medication or develop belly (abdominal) pain which does not seem to be related to the same area of earlier cramping and pain.  You are light headed, weak or have fainting episodes.  You develop pain in the shoulder strap areas.  You have chest or leg pain.  You have abnormal vaginal discharge.  You have painful urination. Document Released: 11/17/2003 Document Revised: 04/01/2011 Document Reviewed: 02/14/2007 [facility] Patient Information 2013 [place], [place]. Hysteroscopy Hysteroscopy is a procedure used for looking inside the womb (uterus). It may be done for many different reasons, including:  To evaluate abnormal bleeding, fibroid (benign, noncancerous) tumors, polyps, scar tissue (adhesions), and possibly cancer of the uterus.  To look for lumps (tumors) and other uterine growths.  To look for causes of why a woman cannot get pregnant (  infertility), causes of recurrent loss of pregnancy (miscarriages), or a lost intrauterine device (IUD).  To perform a sterilization by blocking the fallopian tubes from inside the uterus. A hysteroscopy should be done right after a menstrual period to be sure you are not pregnant. LET YOUR CAREGIVER KNOW ABOUT:   Allergies.  Medicines taken, including herbs, eyedrops, over-the-counter medicines, and creams.  Use of steroids (by mouth or creams).  Previous problems with anesthetics or numbing medicines.  History of bleeding or blood problems.  History of blood clots.  Possibility of pregnancy, if this applies.  Previous surgery.  Other health problems. RISKS AND COMPLICATIONS   Putting a hole in the uterus.  Excessive bleeding.  Infection.  Damage to the  cervix.  Injury to other organs.  Allergic reaction to medicines.  Too much fluid used in the uterus for the procedure. BEFORE THE PROCEDURE   Do not take aspirin or blood thinners for a week before the procedure, or as directed. It can cause bleeding.  Arrive at least 60 minutes before the procedure or as directed to read and sign the necessary forms.  Arrange for someone to take you home after the procedure.  If you smoke, do not smoke for 2 weeks before the procedure. PROCEDURE   Your caregiver may give you medicine to relax you. He or she may also give you a medicine that numbs the area around the cervix (local anesthetic) or a medicine that makes you sleep (general anesthesia).  Sometimes, a medicine is placed in the cervix the day before the procedure. This medicine makes the cervix have a larger opening (dilate). This makes it easier for the instrument to be inserted into the uterus.  A small instrument (hysteroscope) is inserted through the vagina into the uterus. This instrument is similar to a pencil-sized telescope with a light.  During the procedure, air or a liquid is put into the uterus, which allows the surgeon to see better.  Sometimes, tissue is gently scraped from inside the uterus. These tissue samples are sent to a specialist who looks at tissue samples (pathologist). The pathologist will give a report to your caregiver. This will help your caregiver decide if further treatment is necessary. The report will also help your caregiver decide on the best treatment if the test comes back abnormal. AFTER THE PROCEDURE   If you had a general anesthetic, you may be groggy for a couple hours after the procedure.  If you had a local anesthetic, you will be advised to rest at the surgical center or caregiver's office until you are stable and feel ready to go home.  You may have some cramping for a couple days.  You may have bleeding, which varies from light spotting for a  few days to menstrual-like bleeding for up to 3 to 7 days. This is normal.  Have someone take you home. FINDING OUT THE RESULTS OF YOUR TEST Not all test results are available during your visit. If your test results are not back during the visit, make an appointment with your caregiver to find out the results. Do not assume everything is normal if you have not heard from your caregiver or the medical facility. It is important for you to follow up on all of your test results. HOME CARE INSTRUCTIONS   Do not drive for 24 hours or as instructed.  Only take over-the-counter or prescription medicines for pain, discomfort, or fever as directed by your caregiver.  Do not take aspirin. It  can cause or aggravate bleeding.  Do not drive or drink alcohol while taking pain medicine.  You may resume your usual diet.  Do not use tampons, douche, or have sexual intercourse for 2 weeks, or as advised by your caregiver.  Rest and sleep for the first 24 to 48 hours.  Take your temperature twice a day for 4 to 5 days. Write it down. Give these temperatures to your caregiver if they are abnormal (above 98.6 F or 37.0 C).  Take medicines your caregiver has ordered as directed.  Follow your caregiver's advice regarding diet, exercise, lifting, driving, and general activities.  Take showers instead of baths for 2 weeks, or as recommended by your caregiver.  If you develop constipation:  Take a mild laxative with the advice of your caregiver.  Eat bran foods.  Drink enough water and fluids to keep your urine clear or pale yellow.  Try to have someone with you or available to you for the first 24 to 48 hours, especially if you had a general anesthetic.  Make sure you and your family understand everything about your operation and recovery.  Follow your caregiver's advice regarding follow-up appointments and Pap smears. SEEK MEDICAL CARE IF:   You feel dizzy or lightheaded.  You feel sick to  your stomach (nauseous).  You develop abnormal vaginal discharge.  You develop a rash.  You have an abnormal reaction or allergy to your medicine.  You need stronger pain medicine. SEEK IMMEDIATE MEDICAL CARE IF:   Bleeding is heavier than a normal menstrual period or you have blood clots.  You have an oral temperature above 102 F (38.9 C), not controlled by medicine.  You have increasing cramps or pains not relieved with medicine.  You develop belly (abdominal) pain that does not seem to be related to the same area of earlier cramping and pain.  You pass out.  You develop pain in the tops of your shoulders (shoulder strap areas).  You develop shortness of breath. MAKE SURE YOU:   Understand these instructions.  Will watch your condition.  Will get help right away if you are not doing well or get worse. Document Released: 04/15/2000 Document Revised: 04/01/2011 Document Reviewed: 08/08/2008 [facility] Patient Information 2013 [place], [place]. PATIENT INSTRUCTIONS POST-ANESTHESIA  IMMEDIATELY FOLLOWING SURGERY:  Do not drive or operate machinery for the first twenty four hours after surgery.  Do not make any important decisions for twenty four hours after surgery or while taking narcotic pain medications or sedatives.  If you develop intractable nausea and vomiting or a severe headache please notify your doctor immediately.  FOLLOW-UP:  Please make an appointment with your surgeon as instructed. You do not need to follow up with anesthesia unless specifically instructed to do so.  WOUND CARE INSTRUCTIONS (if applicable):  Keep a dry clean dressing on the anesthesia/puncture wound site if there is drainage.  Once the wound has quit draining you may leave it open to air.  Generally you should leave the bandage intact for twenty four hours unless there is drainage.  If the epidural site drains for more than 36-48 hours please call the anesthesia department.  QUESTIONS?:  Please  feel free to call your physician or the hospital operator if you have any questions, and they will be happy to assist you.

## 2012-06-04 NOTE — Patient Instructions (Addendum)

## 2012-06-08 ENCOUNTER — Encounter (HOSPITAL_COMMUNITY): Payer: Self-pay | Admitting: Pharmacy Technician

## 2012-06-10 ENCOUNTER — Ambulatory Visit (HOSPITAL_COMMUNITY): Payer: 59 | Admitting: Anesthesiology

## 2012-06-10 ENCOUNTER — Other Ambulatory Visit: Payer: 59

## 2012-06-10 ENCOUNTER — Encounter (HOSPITAL_COMMUNITY): Payer: Self-pay | Admitting: *Deleted

## 2012-06-10 ENCOUNTER — Ambulatory Visit (HOSPITAL_COMMUNITY)
Admission: RE | Admit: 2012-06-10 | Discharge: 2012-06-10 | Disposition: A | Discharge status: Home / Self Care | Payer: 59 | Source: Ambulatory Visit | Attending: Obstetrics & Gynecology | Admitting: Obstetrics & Gynecology

## 2012-06-10 ENCOUNTER — Encounter (HOSPITAL_COMMUNITY)
Admission: RE | Disposition: A | Discharge status: Home / Self Care | Payer: Self-pay | Source: Ambulatory Visit | Attending: Obstetrics & Gynecology

## 2012-06-10 ENCOUNTER — Encounter (HOSPITAL_COMMUNITY): Payer: Self-pay | Admitting: Anesthesiology

## 2012-06-10 DIAGNOSIS — Z01810 Encounter for preprocedural cardiovascular examination: Secondary | ICD-10-CM | POA: Insufficient documentation

## 2012-06-10 DIAGNOSIS — I10 Essential (primary) hypertension: Secondary | ICD-10-CM | POA: Insufficient documentation

## 2012-06-10 DIAGNOSIS — N946 Dysmenorrhea, unspecified: Secondary | ICD-10-CM | POA: Insufficient documentation

## 2012-06-10 DIAGNOSIS — D250 Submucous leiomyoma of uterus: Secondary | ICD-10-CM

## 2012-06-10 DIAGNOSIS — N920 Excessive and frequent menstruation with regular cycle: Secondary | ICD-10-CM

## 2012-06-10 DIAGNOSIS — Z01812 Encounter for preprocedural laboratory examination: Secondary | ICD-10-CM | POA: Insufficient documentation

## 2012-06-10 DIAGNOSIS — Z9889 Other specified postprocedural states: Secondary | ICD-10-CM

## 2012-06-10 HISTORY — PX: HOLMIUM LASER APPLICATION: SHX5852

## 2012-06-10 HISTORY — PX: DILITATION & CURRETTAGE/HYSTROSCOPY WITH THERMACHOICE ABLATION: SHX5569

## 2012-06-10 LAB — URINE MICROSCOPIC-ADD ON

## 2012-06-10 LAB — URINALYSIS, ROUTINE W REFLEX MICROSCOPIC
Glucose, UA: NEGATIVE mg/dL
Specific Gravity, Urine: >1.03 — ABNORMAL HIGH (ref 1.005–1.030)
pH: 5.5 (ref 5.0–8.0)

## 2012-06-10 SURGERY — DILATATION & CURETTAGE/HYSTEROSCOPY WITH THERMACHOICE ABLATION
Anesthesia: General | Wound class: Clean Contaminated

## 2012-06-10 MED ORDER — DEXTROSE 5 % IV SOLN
INTRAVENOUS | Status: DC | PRN
  Administered 2012-06-10: 26 mL

## 2012-06-10 MED ORDER — ONDANSETRON HCL 4 MG/2ML IJ SOLN
4.0000 mg | Freq: Once | INTRAMUSCULAR | Status: AC | PRN
  Administered 2012-06-10: 4 mg via INTRAVENOUS

## 2012-06-10 MED ORDER — FENTANYL CITRATE 0.05 MG/ML IJ SOLN
INTRAMUSCULAR | Status: AC
  Filled 2012-06-10: qty 5

## 2012-06-10 MED ORDER — LIDOCAINE HCL (CARDIAC) 20 MG/ML IV SOLN
INTRAVENOUS | Status: DC | PRN
  Administered 2012-06-10: 30 mg via INTRAVENOUS

## 2012-06-10 MED ORDER — GLYCOPYRROLATE 0.2 MG/ML IJ SOLN
INTRAMUSCULAR | Status: AC
  Filled 2012-06-10: qty 1

## 2012-06-10 MED ORDER — CEFAZOLIN SODIUM-DEXTROSE 2-3 GM-% IV SOLR
2.0000 g | INTRAVENOUS | Status: AC
  Administered 2012-06-10: 2 g via INTRAVENOUS

## 2012-06-10 MED ORDER — ONDANSETRON HCL 8 MG PO TABS: 8.0000 mg | ORAL_TABLET | Freq: Three times a day (TID) | ORAL | Status: DC | PRN

## 2012-06-10 MED ORDER — ONDANSETRON HCL 4 MG/2ML IJ SOLN
INTRAMUSCULAR | Status: AC
  Filled 2012-06-10: qty 2

## 2012-06-10 MED ORDER — MIDAZOLAM HCL 2 MG/2ML IJ SOLN
1.0000 mg | INTRAMUSCULAR | Status: DC | PRN
  Administered 2012-06-10: 2 mg via INTRAVENOUS

## 2012-06-10 MED ORDER — SCOPOLAMINE 1 MG/3DAYS TD PT72
1.0000 | MEDICATED_PATCH | Freq: Once | TRANSDERMAL | Status: DC
  Administered 2012-06-10: 1.5 mg via TRANSDERMAL

## 2012-06-10 MED ORDER — DEXAMETHASONE SODIUM PHOSPHATE 4 MG/ML IJ SOLN
INTRAMUSCULAR | Status: AC
  Filled 2012-06-10: qty 1

## 2012-06-10 MED ORDER — HYDROCODONE-ACETAMINOPHEN 5-325 MG PO TABS: 1.0000 | ORAL_TABLET | Freq: Four times a day (QID) | ORAL | Status: DC | PRN

## 2012-06-10 MED ORDER — ONDANSETRON HCL 4 MG/2ML IJ SOLN
4.0000 mg | Freq: Once | INTRAMUSCULAR | Status: AC
  Administered 2012-06-10: 4 mg via INTRAVENOUS

## 2012-06-10 MED ORDER — LACTATED RINGERS IV SOLN
INTRAVENOUS | Status: DC
  Administered 2012-06-10: 08:00:00 via INTRAVENOUS

## 2012-06-10 MED ORDER — CEFAZOLIN SODIUM-DEXTROSE 2-3 GM-% IV SOLR
INTRAVENOUS | Status: AC
  Filled 2012-06-10: qty 50

## 2012-06-10 MED ORDER — PROPOFOL 10 MG/ML IV BOLUS
INTRAVENOUS | Status: DC | PRN
  Administered 2012-06-10: 150 mg via INTRAVENOUS

## 2012-06-10 MED ORDER — FENTANYL CITRATE 0.05 MG/ML IJ SOLN
INTRAMUSCULAR | Status: AC
  Filled 2012-06-10: qty 2

## 2012-06-10 MED ORDER — SODIUM CHLORIDE 0.9 % IR SOLN
Status: DC | PRN
  Administered 2012-06-10: 3000 mL

## 2012-06-10 MED ORDER — DEXAMETHASONE SODIUM PHOSPHATE 4 MG/ML IJ SOLN
4.0000 mg | Freq: Once | INTRAMUSCULAR | Status: AC
  Administered 2012-06-10: 4 mg via INTRAVENOUS

## 2012-06-10 MED ORDER — KETOROLAC TROMETHAMINE 10 MG PO TABS: 10.0000 mg | ORAL_TABLET | Freq: Three times a day (TID) | ORAL | Status: DC | PRN

## 2012-06-10 MED ORDER — KETOROLAC TROMETHAMINE 30 MG/ML IJ SOLN
30.0000 mg | Freq: Once | INTRAMUSCULAR | Status: AC
  Administered 2012-06-10: 30 mg via INTRAVENOUS

## 2012-06-10 MED ORDER — SCOPOLAMINE 1 MG/3DAYS TD PT72
MEDICATED_PATCH | TRANSDERMAL | Status: AC
  Filled 2012-06-10: qty 1

## 2012-06-10 MED ORDER — MIDAZOLAM HCL 2 MG/2ML IJ SOLN
INTRAMUSCULAR | Status: AC
  Filled 2012-06-10: qty 2

## 2012-06-10 MED ORDER — KETOROLAC TROMETHAMINE 30 MG/ML IJ SOLN
INTRAMUSCULAR | Status: AC
  Filled 2012-06-10: qty 1

## 2012-06-10 MED ORDER — FENTANYL CITRATE 0.05 MG/ML IJ SOLN
25.0000 ug | INTRAMUSCULAR | Status: DC | PRN
  Administered 2012-06-10 (×4): 50 ug via INTRAVENOUS

## 2012-06-10 MED ORDER — GLYCOPYRROLATE 0.2 MG/ML IJ SOLN
0.2000 mg | Freq: Once | INTRAMUSCULAR | Status: AC
  Administered 2012-06-10: 0.2 mg via INTRAVENOUS

## 2012-06-10 MED ORDER — FENTANYL CITRATE 0.05 MG/ML IJ SOLN
INTRAMUSCULAR | Status: DC | PRN
  Administered 2012-06-10 (×4): 25 ug via INTRAVENOUS
  Administered 2012-06-10: 50 ug via INTRAVENOUS
  Administered 2012-06-10 (×2): 25 ug via INTRAVENOUS

## 2012-06-10 SURGICAL SUPPLY — 36 items
BAG DECANTER FOR FLEXI CONT (MISCELLANEOUS) ×2 IMPLANT
BAG HAMPER (MISCELLANEOUS) ×2 IMPLANT
CATH THERMACHOICE III (CATHETERS) ×2 IMPLANT
CLOTH BEACON ORANGE TIMEOUT ST (SAFETY) ×2 IMPLANT
COVER LIGHT HANDLE STERIS (MISCELLANEOUS) ×4 IMPLANT
ELECT REM PT RETURN 9FT ADLT (ELECTROSURGICAL) ×2
ELECTRODE REM PT RTRN 9FT ADLT (ELECTROSURGICAL) ×1 IMPLANT
FORMALIN 10 PREFIL 120ML (MISCELLANEOUS) ×3 IMPLANT
GAUZE SPONGE 4X4 16PLY XRAY LF (GAUZE/BANDAGES/DRESSINGS) ×2 IMPLANT
GLOVE BIOGEL PI IND STRL 7.0 (GLOVE) IMPLANT
GLOVE BIOGEL PI IND STRL 8 (GLOVE) ×1 IMPLANT
GLOVE BIOGEL PI INDICATOR 7.0 (GLOVE) ×1
GLOVE BIOGEL PI INDICATOR 8 (GLOVE) ×1
GLOVE ECLIPSE 8.0 STRL XLNG CF (GLOVE) ×2 IMPLANT
GLOVE EXAM NITRILE MD LF STRL (GLOVE) ×2 IMPLANT
GLOVE SS BIOGEL STRL SZ 6.5 (GLOVE) IMPLANT
GLOVE SUPERSENSE BIOGEL SZ 6.5 (GLOVE) ×1
GOWN STRL REIN XL XLG (GOWN DISPOSABLE) ×5 IMPLANT
INST SET HYSTEROSCOPY (KITS) ×2 IMPLANT
IV D5W 500ML (IV SOLUTION) ×2 IMPLANT
IV NS IRRIG 3000ML ARTHROMATIC (IV SOLUTION) ×2 IMPLANT
KIT ROOM TURNOVER APOR (KITS) ×2 IMPLANT
LASER FIBER DISP 1000U (UROLOGICAL SUPPLIES) ×1 IMPLANT
MANIFOLD NEPTUNE II (INSTRUMENTS) ×2 IMPLANT
MARKER SKIN DUAL TIP RULER LAB (MISCELLANEOUS) ×2 IMPLANT
NS IRRIG 1000ML POUR BTL (IV SOLUTION) ×2 IMPLANT
PACK BASIC III (CUSTOM PROCEDURE TRAY) ×2
PACK SRG BSC III STRL LF ECLPS (CUSTOM PROCEDURE TRAY) ×1 IMPLANT
PAD ARMBOARD 7.5X6 YLW CONV (MISCELLANEOUS) ×2 IMPLANT
PAD TELFA 3X4 1S STER (GAUZE/BANDAGES/DRESSINGS) ×3 IMPLANT
SET BASIN LINEN APH (SET/KITS/TRAYS/PACK) ×2 IMPLANT
SET IRRIG Y TYPE TUR BLADDER L (SET/KITS/TRAYS/PACK) ×2 IMPLANT
SHEET LAVH (DRAPES) ×2 IMPLANT
TOWEL OR 17X26 4PK STRL BLUE (TOWEL DISPOSABLE) ×2 IMPLANT
WATER STERILE IRR 1000ML POUR (IV SOLUTION) ×2 IMPLANT
YANKAUER SUCT BULB TIP 10FT TU (MISCELLANEOUS) ×2 IMPLANT

## 2012-06-10 NOTE — Op Note (Signed)
Preoperative diagnosis: Menometrorrhagia                                        Dysmenorrhea                                         Submucosal myoma, 1.6 cm  Postoperative diagnoses: Same as above   Procedure: Hysteroscopy, uterine curettage, endometrial ablation                    Removal of submucosal myoma with Holmium laser  Surgeon: Despina Hidden MD  Anesthesia: Laryngeal mask airway  Findings: The endometrium was normal. There were no fibroid or other abnormalities.  Description of operation: The patient was taken to the operating room and placed in the supine position. She underwent general anesthesia using the laryngeal mask airway. She was placed in the dorsal lithotomy position and prepped and draped in the usual sterile fashion. A Graves speculum was placed and the anterior cervical lip was grasped with a single-tooth tenaculum. The cervix was dilated serially to allow passage of the hysteroscope. Diagnostic hysteroscopy was performed and was found to be normal. A vigorous uterine curettage was then performed and all tissue sent to pathology for evaluation.  The submucosal myoma was identified and could not be removed with curettage or other instruments, it had a broad base.  The Holmium laser was employed and removed without difficulty.   The ThermaChoice 3 endometrial ablation balloon was then used were 26 cc of D5W was required to maintain a pressure of 190-200 mm of mercury throughout the procedure. Toatl therapy time was 10:41.  All of the equipment worked well throughout the procedure. All of the fluid was returned at the end of the procedure. The patient was awakened from anesthesia and taken to the recovery room in good stable condition all counts were correct. She received 2 g of Ancef and 30 mg of Toradol preoperatively. She will be discharged from the recovery room and followed up in the office in 1- 2 weeks.  Esteen Delpriore H 06/10/2012 10:05 AM

## 2012-06-10 NOTE — Anesthesia Procedure Notes (Signed)
Procedure Name: LMA Insertion Date/Time: 06/10/2012 9:01 AM Performed by: Glynn Octave E Pre-anesthesia Checklist: Patient identified, Patient being monitored, Emergency Drugs available, Timeout performed and Suction available Patient Re-evaluated:Patient Re-evaluated prior to inductionOxygen Delivery Method: Circle System Utilized Preoxygenation: Pre-oxygenation with 100% oxygen Intubation Type: IV induction Ventilation: Mask ventilation without difficulty LMA: LMA inserted LMA Size: 4.0 and 3.0 Number of attempts: 1 Placement Confirmation: positive ETCO2 and breath sounds checked- equal and bilateral

## 2012-06-10 NOTE — Anesthesia Postprocedure Evaluation (Signed)
  Anesthesia Post-op Note  Patient: Brittany Daniels  Procedure(s) Performed: Procedure(s): DILATATION & CURETTAGE/HYSTEROSCOPY WITH THERMACHOICE ABLATION (TOTAL THERAPY TIME=35min19sec)  (N/A) HOLMIUM LASER REMOVAL OF SUBMUCOSAL MYOMA (N/A)  Patient Location: PACU  Anesthesia Type:General  Level of Consciousness: awake, alert  and oriented  Airway and Oxygen Therapy: Patient Spontanous Breathing and Patient connected to face mask oxygen  Post-op Pain: none  Post-op Assessment: Post-op Vital signs reviewed, Patient's Cardiovascular Status Stable, Respiratory Function Stable, Patent Airway and No signs of Nausea or vomiting  Post-op Vital Signs: Reviewed and stable  Complications: No apparent anesthesia complications

## 2012-06-10 NOTE — H&P (Signed)
Preoperative History and Physical  Brittany Daniels is a 47 y.o. G2P2. with Patient's last menstrual period was 04/21/2012. admitted for a hysteroscopy uterine curettage endometrial ablation and removal of submucosal myoma.  Responded to megace well no bleeding, does have a1,6 cm submucosal myoma which we will remove.  PMH:  Past Medical History   Diagnosis  Date   .  Hypertension    .  Migraine    .  Hyperlipidemia    PSH:  Past Surgical History   Procedure  Laterality  Date   .  Sinus surgery with instatrak     .  Tonsilectomy, adenoidectomy, bilateral myringotomy and tubes     .  Molar abstraction     POb/GynH: G2P2  OB History    Grav  Para  Term  Preterm  Abortions  TAB  SAB  Ect  Mult  Living                 SH:  History   Substance Use Topics   .  Smoking status:  Never Smoker   .  Smokeless tobacco:  Not on file   .  Alcohol Use:  No   FH:  Family History   Problem  Relation  Age of Onset   .  Hypertension  Mother    .  Diabetes  Father    .  Cancer  Maternal Grandmother      breast cancer   .  Cancer  Maternal Grandfather      skin cancer   Allergies:  Allergies   Allergen  Reactions   .  Ace Inhibitors  Cough   .  Oxycodone Hcl Er  Itching   Medications: Current outpatient prescriptions:ALPRAZolam (XANAX) 0.5 MG tablet, Take 0.5 mg by mouth 3 (three) times daily., Disp: , Rfl: ; cholecalciferol (VITAMIN D) 1000 UNITS tablet, Take 1,000 Units by mouth daily., Disp: , Rfl: ; fish oil-omega-3 fatty acids 1000 MG capsule, Take 2 g by mouth daily., Disp: , Rfl: ; ibuprofen (ADVIL,MOTRIN) 200 MG tablet, Take 400 mg by mouth every 6 (six) hours as needed., Disp: , Rfl:  megestrol (MEGACE) 40 MG tablet, Take 1 tablet (40 mg total) by mouth daily., Disp: 30 tablet, Rfl: 2; olmesartan-hydrochlorothiazide (BENICAR HCT) 40-25 MG per tablet, Take 1 tablet by mouth daily., Disp: , Rfl: ; acetaminophen (TYLENOL) 500 MG tablet, Take 500 mg by mouth every 6 (six) hours as needed.,  Disp: , Rfl: ; rOPINIRole (REQUIP) 0.5 MG tablet, Take 0.5 mg by mouth at bedtime., Disp: , Rfl:  Review of Systems:  Review of Systems  Constitutional: Negative for fever, chills, weight loss, malaise/fatigue and diaphoresis.  HENT: Negative for hearing loss, ear pain, nosebleeds, congestion, sore throat, neck pain, tinnitus and ear discharge.  Eyes: Negative for blurred vision, double vision, photophobia, pain, discharge and redness.  Respiratory: Negative for cough, hemoptysis, sputum production, shortness of breath, wheezing and stridor.  Cardiovascular: Negative for chest pain, palpitations, orthopnea, claudication, leg swelling and PND.  Gastrointestinal: negative for abdominal pain. Negative for heartburn, nausea, vomiting, diarrhea, constipation, blood in stool and melena.  Genitourinary: Negative for dysuria, urgency, frequency, hematuria and flank pain.  Musculoskeletal: Negative for myalgias, back pain, joint pain and falls.  Skin: Negative for itching and rash.  Neurological: Negative for dizziness, tingling, tremors, sensory change, speech change, focal weakness, seizures, loss of consciousness, weakness and headaches.  Endo/Heme/Allergies: Negative for environmental allergies and polydipsia. Does not bruise/bleed easily.  Psychiatric/Behavioral: Negative for depression,  suicidal ideas, hallucinations, memory loss and substance abuse. The patient is not nervous/anxious and does not have insomnia.  PHYSICAL EXAM:  Blood pressure 90/60, weight 206 lb (93.441 kg), last menstrual period 04/21/2012.  Vitals reviewed.  Constitutional: She is oriented to person, place, and time. She appears well-developed and well-nourished.  HENT:  Head: Normocephalic and atraumatic.  Right Ear: External ear normal.  Left Ear: External ear normal.  Nose: Nose normal.  Mouth/Throat: Oropharynx is clear and moist.  Eyes: Conjunctivae and EOM are normal. Pupils are equal, round, and reactive to light.  Right eye exhibits no discharge. Left eye exhibits no discharge. No scleral icterus.  Neck: Normal range of motion. Neck supple. No tracheal deviation present. No thyromegaly present.  Cardiovascular: Normal rate, regular rhythm, normal heart sounds and intact distal pulses. Exam reveals no gallop and no friction rub.  No murmur heard.  Respiratory: Effort normal and breath sounds normal. No respiratory distress. She has no wheezes. She has no rales. She exhibits no tenderness.  GI: Soft. Bowel sounds are normal. She exhibits no distension and no mass. There is tenderness. There is no rebound and no guarding.  Genitourinary:  Vulva is normal without lesions Vagina is pink moist without discharge Cervix normal in appearance and pap is normal Uterus is normal size sonogram reveals multiple small myomsa  Adnexa is negative with normal sized ovaries by sonogram  Musculoskeletal: Normal range of motion. She exhibits no edema and no tenderness.  Neurological: She is alert and oriented to person, place, and time. She has normal reflexes. She displays normal reflexes. No cranial nerve deficit. She exhibits normal muscle tone. Coordination normal.  Skin: Skin is warm and dry. No rash noted. No erythema. No pallor.  Psychiatric: She has a normal mood and affect. Her behavior is normal. Judgment and thought content normal.  Labs:  EKG:  Imaging Studies:  Assessment:  Menometrorrhagia  Dysmenorrhea  Submucosal myoma  Patient Active Problem List    Diagnosis  Date Noted   .  Hyperlipidemia  04/30/2012   .  Anxiety  04/30/2012   .  Benign essential hypertension with delivery  04/30/2012   Plan:  Hysteroscopy uterine curettage endometrial ablation and removal of submucosal myoma by laser    EURE,LUTHER H 06/10/2012 8:17 AM

## 2012-06-10 NOTE — Transfer of Care (Signed)
Immediate Anesthesia Transfer of Care Note  Patient: Brittany Daniels  Procedure(s) Performed: Procedure(s): DILATATION & CURETTAGE/HYSTEROSCOPY WITH THERMACHOICE ABLATION (TOTAL THERAPY TIME=19min19sec)  (N/A) HOLMIUM LASER REMOVAL OF SUBMUCOSAL MYOMA (N/A)  Patient Location: PACU  Anesthesia Type:General  Level of Consciousness: awake, alert  and oriented  Airway & Oxygen Therapy: Patient Spontanous Breathing and Patient connected to face mask oxygen  Post-op Assessment: Report given to PACU RN  Post vital signs: Reviewed and stable  Complications: No apparent anesthesia complications

## 2012-06-10 NOTE — Anesthesia Preprocedure Evaluation (Addendum)
Anesthesia Evaluation  Patient identified by MRN, date of birth, ID band Patient awake    Reviewed: Allergy & Precautions, H&P , NPO status , Patient's Chart, lab work & pertinent test results  History of Anesthesia Complications (+) PONV  Airway Mallampati: III TM Distance: >3 FB Neck ROM: Full    Dental  (+) Teeth Intact   Pulmonary neg pulmonary ROS,  breath sounds clear to auscultation        Cardiovascular hypertension, Pt. on medications Rhythm:Regular Rate:Normal     Neuro/Psych  Headaches, PSYCHIATRIC DISORDERS Anxiety    GI/Hepatic   Endo/Other    Renal/GU      Musculoskeletal   Abdominal   Peds  Hematology   Anesthesia Other Findings   Reproductive/Obstetrics                           Anesthesia Physical Anesthesia Plan  ASA: II  Anesthesia Plan: General   Post-op Pain Management:    Induction: Intravenous  Airway Management Planned: LMA  Additional Equipment:   Intra-op Plan:   Post-operative Plan: Extubation in OR  Informed Consent: I have reviewed the patients History and Physical, chart, labs and discussed the procedure including the risks, benefits and alternatives for the proposed anesthesia with the patient or authorized representative who has indicated his/her understanding and acceptance.     Plan Discussed with:   Anesthesia Plan Comments:         Anesthesia Quick Evaluation

## 2012-06-11 ENCOUNTER — Encounter: Payer: 59 | Admitting: Obstetrics & Gynecology

## 2012-06-11 ENCOUNTER — Other Ambulatory Visit: Payer: 59

## 2012-06-12 ENCOUNTER — Encounter (HOSPITAL_COMMUNITY): Payer: Self-pay | Admitting: Obstetrics & Gynecology

## 2012-06-12 ENCOUNTER — Telehealth: Payer: Self-pay | Admitting: Obstetrics & Gynecology

## 2012-06-12 NOTE — Telephone Encounter (Signed)
Pt has an appt for post op on Tuesday, June 2. Pt encouraged to wait to see Dr. Despina Hidden before returning to [facility] to make sure everything has healed from the Endo. Ablation. Pt verbalized understanding.

## 2012-06-22 ENCOUNTER — Encounter: Payer: Self-pay | Admitting: Obstetrics & Gynecology

## 2012-06-22 ENCOUNTER — Ambulatory Visit (INDEPENDENT_AMBULATORY_CARE_PROVIDER_SITE_OTHER): Payer: 59 | Admitting: Obstetrics & Gynecology

## 2012-06-22 VITALS — BP 128/88 | Ht 64.0 in | Wt 200.0 lb

## 2012-06-22 DIAGNOSIS — Z9889 Other specified postprocedural states: Secondary | ICD-10-CM

## 2012-06-22 NOTE — Progress Notes (Signed)
Patient ID: Brittany Daniels, female   DOB: Oct 19, 1965, 47 y.o.   MRN: [id number]  HPI: Patient returns for routine postoperative follow-up having undergone endometrial ablation, hysteroscopy , uterine curettage and Holmium laser asissted removal of submucosal myoma on 5.21.2014. The patient's early postoperative recovery while in the hospital was notable for out patient and unremarkable. Since hospital discharge the patient reports doing well.   Current Outpatient Prescriptions  Medication Sig Dispense Refill  . acetaminophen (TYLENOL) 500 MG tablet Take 500 mg by mouth every 6 (six) hours as needed for pain.       Marland Kitchen ALPRAZolam (XANAX) 0.5 MG tablet Take 0.5 mg by mouth 3 (three) times daily.      . cholecalciferol (VITAMIN D) 1000 UNITS tablet Take 1,000 Units by mouth daily.      . fish oil-omega-3 fatty acids 1000 MG capsule Take 2 g by mouth daily.      Marland Kitchen HYDROcodone-acetaminophen (NORCO/VICODIN) 5-325 MG per tablet Take 1 tablet by mouth every 6 (six) hours as needed for pain.  30 tablet  0  . ibuprofen (ADVIL,MOTRIN) 200 MG tablet Take 400 mg by mouth every 6 (six) hours as needed for pain.       Marland Kitchen olmesartan-hydrochlorothiazide (BENICAR HCT) 40-25 MG per tablet Take 1 tablet by mouth daily.      Marland Kitchen ketorolac (TORADOL) 10 MG tablet Take 1 tablet (10 mg total) by mouth every 8 (eight) hours as needed for pain.  15 tablet  0  . ondansetron (ZOFRAN) 8 MG tablet Take 1 tablet (8 mg total) by mouth every 8 (eight) hours as needed for nausea.  24 tablet  1   No current facility-administered medications for this visit.    Physical Exam: NEFG Vagina pinkish discharge No CMY uterus normal post ablation  Diagnostic Tests:   Impression: Normal post op course  Plan: Follow up as needed or yearly

## 2012-10-19 ENCOUNTER — Other Ambulatory Visit (HOSPITAL_COMMUNITY): Payer: Self-pay | Admitting: Family Medicine

## 2012-10-19 DIAGNOSIS — Z139 Encounter for screening, unspecified: Secondary | ICD-10-CM

## 2012-11-23 ENCOUNTER — Ambulatory Visit (HOSPITAL_COMMUNITY)
Admission: RE | Admit: 2012-11-23 | Discharge: 2012-11-23 | Disposition: A | Discharge status: Home / Self Care | Payer: 59 | Source: Ambulatory Visit | Attending: Family Medicine | Admitting: Family Medicine

## 2012-11-23 ENCOUNTER — Ambulatory Visit (HOSPITAL_COMMUNITY): Payer: 59

## 2012-11-23 DIAGNOSIS — Z139 Encounter for screening, unspecified: Secondary | ICD-10-CM

## 2012-11-23 DIAGNOSIS — Z1231 Encounter for screening mammogram for malignant neoplasm of breast: Secondary | ICD-10-CM | POA: Insufficient documentation

## 2012-12-11 ENCOUNTER — Ambulatory Visit: Payer: 59 | Admitting: Obstetrics & Gynecology

## 2012-12-21 ENCOUNTER — Ambulatory Visit (INDEPENDENT_AMBULATORY_CARE_PROVIDER_SITE_OTHER): Payer: 59 | Admitting: Obstetrics & Gynecology

## 2012-12-21 ENCOUNTER — Encounter: Payer: Self-pay | Admitting: Obstetrics & Gynecology

## 2012-12-21 VITALS — BP 110/80 | Wt 198.0 lb

## 2012-12-21 DIAGNOSIS — N9089 Other specified noninflammatory disorders of vulva and perineum: Secondary | ICD-10-CM

## 2012-12-21 DIAGNOSIS — N907 Vulvar cyst: Secondary | ICD-10-CM

## 2012-12-21 NOTE — Progress Notes (Signed)
Patient ID: Brittany Daniels, female   DOB: 06-Nov-1965, 47 y.o.   MRN: [id number] Post op from endometrial ablation and laser removal of submucosal leiomyoma  Pt here today not for post op but to have her vulvar sebceous cysts evluated No complaints  Exam Normal stable non infected No follow up needed fo rthis  Yearly 7 months

## 2013-11-03 ENCOUNTER — Other Ambulatory Visit (HOSPITAL_COMMUNITY): Payer: Self-pay | Admitting: Family Medicine

## 2013-11-03 DIAGNOSIS — Z1231 Encounter for screening mammogram for malignant neoplasm of breast: Secondary | ICD-10-CM

## 2013-11-25 ENCOUNTER — Ambulatory Visit (HOSPITAL_COMMUNITY)
Admission: RE | Admit: 2013-11-25 | Discharge: 2013-11-25 | Disposition: A | Discharge status: Home / Self Care | Payer: 59 | Source: Ambulatory Visit | Attending: Family Medicine | Admitting: Family Medicine

## 2013-11-25 DIAGNOSIS — R928 Other abnormal and inconclusive findings on diagnostic imaging of breast: Secondary | ICD-10-CM | POA: Insufficient documentation

## 2013-11-25 DIAGNOSIS — Z1231 Encounter for screening mammogram for malignant neoplasm of breast: Secondary | ICD-10-CM

## 2013-11-30 ENCOUNTER — Other Ambulatory Visit: Payer: Self-pay | Admitting: Family Medicine

## 2013-11-30 DIAGNOSIS — R928 Other abnormal and inconclusive findings on diagnostic imaging of breast: Secondary | ICD-10-CM

## 2013-12-21 ENCOUNTER — Ambulatory Visit (HOSPITAL_COMMUNITY)
Admission: RE | Admit: 2013-12-21 | Discharge: 2013-12-21 | Disposition: A | Discharge status: Home / Self Care | Payer: 59 | Source: Ambulatory Visit | Attending: Family Medicine | Admitting: Family Medicine

## 2013-12-21 DIAGNOSIS — R928 Other abnormal and inconclusive findings on diagnostic imaging of breast: Secondary | ICD-10-CM | POA: Diagnosis not present

## 2013-12-29 ENCOUNTER — Emergency Department (HOSPITAL_COMMUNITY)
Admission: EM | Admit: 2013-12-29 | Discharge: 2013-12-29 | Disposition: A | Discharge status: Home / Self Care | Payer: 59 | Attending: Emergency Medicine | Admitting: Emergency Medicine

## 2013-12-29 ENCOUNTER — Encounter (HOSPITAL_COMMUNITY): Payer: Self-pay

## 2013-12-29 ENCOUNTER — Emergency Department (HOSPITAL_COMMUNITY): Payer: 59

## 2013-12-29 DIAGNOSIS — F419 Anxiety disorder, unspecified: Secondary | ICD-10-CM | POA: Diagnosis not present

## 2013-12-29 DIAGNOSIS — R1012 Left upper quadrant pain: Secondary | ICD-10-CM | POA: Insufficient documentation

## 2013-12-29 DIAGNOSIS — Z3202 Encounter for pregnancy test, result negative: Secondary | ICD-10-CM | POA: Diagnosis not present

## 2013-12-29 DIAGNOSIS — R1013 Epigastric pain: Secondary | ICD-10-CM | POA: Diagnosis not present

## 2013-12-29 DIAGNOSIS — G43909 Migraine, unspecified, not intractable, without status migrainosus: Secondary | ICD-10-CM | POA: Insufficient documentation

## 2013-12-29 DIAGNOSIS — Z8639 Personal history of other endocrine, nutritional and metabolic disease: Secondary | ICD-10-CM | POA: Insufficient documentation

## 2013-12-29 DIAGNOSIS — R1010 Upper abdominal pain, unspecified: Secondary | ICD-10-CM | POA: Diagnosis present

## 2013-12-29 DIAGNOSIS — I10 Essential (primary) hypertension: Secondary | ICD-10-CM | POA: Diagnosis not present

## 2013-12-29 DIAGNOSIS — Z79899 Other long term (current) drug therapy: Secondary | ICD-10-CM | POA: Diagnosis not present

## 2013-12-29 DIAGNOSIS — R109 Unspecified abdominal pain: Secondary | ICD-10-CM

## 2013-12-29 LAB — LIPASE, BLOOD: Lipase: 14 U/L (ref 11–59)

## 2013-12-29 LAB — CBC
HEMATOCRIT: 39.5 % (ref 36.0–46.0)
Hemoglobin: 13.1 g/dL (ref 12.0–15.0)
MCH: 31.1 pg (ref 26.0–34.0)
MCHC: 33.2 g/dL (ref 30.0–36.0)
MCV: 93.8 fL (ref 78.0–100.0)
PLATELETS: 274 10*3/uL (ref 150–400)
RBC: 4.21 MIL/uL (ref 3.87–5.11)
RDW: 12.9 % (ref 11.5–15.5)
WBC: 7.8 10*3/uL (ref 4.0–10.5)

## 2013-12-29 LAB — HEPATIC FUNCTION PANEL
ALT: 24 U/L (ref 0–35)
AST: 22 U/L (ref 0–37)
Albumin: 3.7 g/dL (ref 3.5–5.2)
Alkaline Phosphatase: 46 U/L (ref 39–117)
BILIRUBIN TOTAL: 0.9 mg/dL (ref 0.3–1.2)
Total Protein: 7.1 g/dL (ref 6.0–8.3)

## 2013-12-29 LAB — BASIC METABOLIC PANEL
ANION GAP: 11 (ref 5–15)
BUN: 10 mg/dL (ref 6–23)
CHLORIDE: 99 meq/L (ref 96–112)
CO2: 27 mEq/L (ref 19–32)
Calcium: 9.5 mg/dL (ref 8.4–10.5)
Creatinine, Ser: 1.02 mg/dL (ref 0.50–1.10)
GFR calc non Af Amer: 64 mL/min — ABNORMAL LOW (ref >90)
GFR, EST AFRICAN AMERICAN: 74 mL/min — AB (ref >90)
Glucose, Bld: 97 mg/dL (ref 70–99)
POTASSIUM: 3.8 meq/L (ref 3.7–5.3)
SODIUM: 137 meq/L (ref 137–147)

## 2013-12-29 LAB — URINALYSIS, ROUTINE W REFLEX MICROSCOPIC
BILIRUBIN URINE: NEGATIVE
Glucose, UA: NEGATIVE mg/dL
Hgb urine dipstick: NEGATIVE
KETONES UR: NEGATIVE mg/dL
Leukocytes, UA: NEGATIVE
NITRITE: NEGATIVE
PH: 5 (ref 5.0–8.0)
PROTEIN: NEGATIVE mg/dL
Specific Gravity, Urine: 1.025 (ref 1.005–1.030)
UROBILINOGEN UA: 0.2 mg/dL (ref 0.0–1.0)

## 2013-12-29 LAB — PREGNANCY, URINE: PREG TEST UR: NEGATIVE

## 2013-12-29 MED ORDER — FAMOTIDINE 20 MG PO TABS
40.0000 mg | ORAL_TABLET | Freq: Once | ORAL | Status: AC
  Administered 2013-12-29: 40 mg via ORAL
  Filled 2013-12-29: qty 2

## 2013-12-29 MED ORDER — GI COCKTAIL ~~LOC~~
30.0000 mL | Freq: Once | ORAL | Status: AC
  Administered 2013-12-29: 30 mL via ORAL
  Filled 2013-12-29: qty 30

## 2013-12-29 MED ORDER — ONDANSETRON HCL 4 MG PO TABS: 4.0000 mg | ORAL_TABLET | Freq: Three times a day (TID) | ORAL | Status: DC | PRN

## 2013-12-29 MED ORDER — ONDANSETRON 8 MG PO TBDP
8.0000 mg | ORAL_TABLET | Freq: Once | ORAL | Status: AC
  Administered 2013-12-29: 8 mg via ORAL
  Filled 2013-12-29: qty 1

## 2013-12-29 NOTE — Discharge Instructions (Signed)
°Emergency Department Resource Guide °1) Find a Doctor and Pay Out of Pocket °Although you won't have to find out who is covered by your insurance plan, it is a good idea to ask around and get recommendations. You will then need to call the office and see if the doctor you have chosen will accept you as a new patient and what types of options they offer for patients who are self-pay. Some doctors offer discounts or will set up payment plans for their patients who do not have insurance, but you will need to ask so you aren't surprised when you get to your appointment. ° °2) Contact Your Local Health Department °Not all health departments have doctors that can see patients for sick visits, but many do, so it is worth a call to see if yours does. If you don't know where your local health department is, you can check in your phone book. The CDC also has a tool to help you locate your state's health department, and many state websites also have listings of all of their local health departments. ° °3) Find a Walk-in Clinic °If your illness is not likely to be very severe or complicated, you may want to try a walk in clinic. These are popping up all over the country in pharmacies, drugstores, and shopping centers. They're usually staffed by nurse practitioners or physician assistants that have been trained to treat common illnesses and complaints. They're usually fairly quick and inexpensive. However, if you have serious medical issues or chronic medical problems, these are probably not your best option. ° °No Primary Care Doctor: °- Call [facility] at  832-8000 - they can help you locate a primary care doctor that  accepts your insurance, provides certain services, etc. °- Physician Referral Service- 1-800-533-3463 ° °Chronic Pain Problems: °Organization         Address  Phone   Notes  °[facility]  (336) 297-2271 Patients need to be referred by their primary care doctor.  ° °Medication  Assistance: °Organization         Address  Phone   Notes  °[place] [facility] [address] °[place], [place] [address] (336) 641-8030 --Must be a resident of [place] °-- Must have NO insurance coverage whatsoever (no Medicaid/ Medicare, etc.) °-- The pt. MUST have a primary care doctor that directs their care regularly and follows them in the community °  °[facility]  (866) 331-1348   °[employer]  (888) 892-1162   ° °Agencies that provide inexpensive medical care: °Organization         Address  Phone   Notes  °[facility]  (336) 832-8035   ° Internal Medicine    (336) 832-7272   °[facility] [address] °[place], [place] [address] (336) 832-4777   °[facility] [address], °[place] (336) 271-4999   °[facility]    (336) 373-0678   °[facility]    (336) 272-1050   °[facility] ° [address],  Phone:  (336) 832-4444, Fax:  (336) 832-4440 Hours of Operation:  9 am - 6 pm, M-F.  Also accepts Medicaid/Medicare and self-pay.  °[facility] ° [address],  Phone: (336) 832-3150, Fax: (336) 832-3151. Hours of Operation:  8:30 am - 5:30 pm, M-F.  Also accepts Medicaid and self-pay.  °[facility] [place] [address], [place] Phone: (336) 878-6027   °[facility] [address], [place], [place] (336)723-1848, Ext. 123 Mondays & Thursdays: 7-9 AM.  First 15 patients are seen on a first come, first serve basis. °  ° °Medicaid-accepting [place] Providers: ° °Organization         Address  Phone   Notes  °[facility] [address], [place] (336) 641-2100 Also accepts self-pay patients.  °[facility] [address], [place] ° (336) 856-9996   °[facility] [address], [place]  (336) 288-8857   °[facility] [address] [place] [address], [place] (336) 299-7000   °Veita Bland [address], [place]  ° (336) 373-1557 Only accepts [place] Access Medicaid patients after they have their name applied to their card.  ° °Self-Pay (no insurance) in [place]: ° °Organization         Address  Phone   Notes  °Sickle Cell Patients, [facility] [address], [place] (336) 832-1970   °[facility] [address], [place] (336) 832-4400   °[facility] ° [address], [place] (336) 992-4800   °[facility]/Dr. Osei-Bonsu ° [address] [place] [address], [place] or [address], [place] (336) 841-8500 Phone number for both [place] and [place] locations is the same.  °[facility] [address], [place] (336) 299-0000   °[facility] [address] [place] [address], [place] or [address] (336) 852-7530 °(336) 878-2260   °[facility] [address], [place] (336) 350-1642, phone; (336) 294-5005, fax Sees patients 1st and 3rd Saturday of every month.  Must not qualify for public or private insurance (i.e. Medicaid, Medicare, [place] Health Choice, Veterans' Benefits) • Household income should be no more than 200% of the poverty level •The clinic cannot treat you if you are pregnant or think you are pregnant • Sexually transmitted diseases are not treated at the clinic.  ° ° °Dental Care: °Organization         Address  Phone  Notes  °[place] [facility] [address], [place] (336) 641-6152 Accepts children up to age 21 who are enrolled in Medicaid or [place] Health Choice; pregnant women with a Medicaid card; and children who have applied for Medicaid or [place] Health Choice, but were declined, whose parents can pay a reduced fee at time of service.  °[place]  [facility] [place]  [address], [place] (336) 641-7733 Accepts children up to age 21 who are enrolled in Medicaid or [place] Health Choice; pregnant women with a Medicaid card; and children who have applied for Medicaid or  Health Choice, but were declined, whose parents can pay a reduced fee at time of service.  °[facility] ° [address], [place] (336) 641-4533 Patients are seen by appointment only. Walk-ins are not accepted. [facility] will see patients 18 years of age and older. °Monday - Tuesday (8am-5pm) °Most Wednesdays (8:30-5pm) °$30 per visit, cash only  °[facility] ° [address], [place] (336) 641-4533 Patients are seen by appointment only. Walk-ins are not accepted. [facility] will see patients 18 years of age and older. °One   Wednesday Evening (Monthly: Volunteer Based).  $30 per visit, cash only  °[facility]  (919) 537-3737 for adults; Children under age 4, call Graduate Pediatric Dentistry at ([phone]) [phone]. Children aged 4-14, please call (919) [phone] to request a pediatric application. ° Dental services are provided in all areas of dental care including fillings, crowns and bridges, complete and partial dentures, implants, gum treatment, root canals, and extractions. Preventive care is also provided. Treatment is provided to both adults and children. °Patients are selected via a lottery and there is often a waiting list. °  °[facility] [address], °[place] ° (336) 763-8833 [email or web address] °  °[facility] [address], [place], [place] (336)723-1848, Ext. 123 Second and Fourth Thursday of each month, opens at 6:30 AM; Clinic ends at 9 AM.  Patients are seen on a first-come first-served basis, and a limited number are seen during each clinic.  ° °[facility] ° [address], [place], [place] (336) 723-7904    Eligibility Requirements °You must have lived in [place], or [place] counties for at least the last three months. °  You cannot be eligible for state or federal sponsored healthcare insurance, including Veterans Administration, Medicaid, or Medicare. °  You generally cannot be eligible for healthcare insurance through your employer.  °  How to apply: °Eligibility screenings are held every Tuesday and Wednesday afternoon from 1:00 pm until 4:00 pm. You do not need an appointment for the interview!  °[facility] [address], [place], [place] 336-631-2330   °[facility]  336-342-8273   °[facility]  336-703-3100   °[facility]  336-570-6415   ° °Behavioral Health Resources in the Community: °Intensive Outpatient Programs °Organization         Address  Phone  Notes  °[facility] [address], [place], [place] 336-878-6098   °[facility] [address], [place], [place] 336-832-9800   °[facility] [address], [place], [place] ° 336-882-2125   °[facility] [address],  °[place], [place] 1-800-853-5163 or 336-641-4981   °Substance Abuse Resources °Organization         Address  Phone  Notes  °[facility]  336-882-2125   °[facility]  336-784-9470   °[facility]  336-285-9073   °[facility]  336-845-3988   °Residential & Outpatient Substance Abuse Program  1-800-659-3381   °Psychological Services °Organization         Address  Phone  Notes  °[facility]  336- 832-9600   °[facility]  336- 378-7881   °[facility] [address], [place] 1-800-853-5163 or 336-641-4981   ° °Mobile Crisis Teams °Organization         Address  Phone  Notes  °[facility], Mobile Crisis Care Unit  1-877-626-1772   °Assertive °[facility] ° [address]  [place], [place] 336-834-9664   °Sharon DeEsch [address] °[place] 336-554-5454   ° °Self-Help/Support Groups °Organization         Address  Phone             Notes  °[facility]  - variety of support groups  336- 373-1402 Call for more information  °Narcotics Anonymous (NA), [facility] [address], °[place]   2 meetings at this location  ° °  Residential Treatment Programs Organization         Address  Phone  Notes  [facility] [address]    [place]  1-469 135 0192   [facility]  [address], [place] [address], [place], [place]   [place], [place] (979)278-1109 Admissions: 8am-3pm M-F  [facility] [place] [address]    [place], [place] 588-502-7741   [facility] [address] [place], [place], [place]   [facility] [address],  [place], [place]   [facility] Intensive Outpatient [address], [place], [place]   [facility] ([place] [address]  [place], [place] 1-443-013-6360 or 9380090835   [facility] [address], [place], [place] Accepts Medicaid  [facility] [address]  [place] 1-(615)817-1030 Substance Abuse/Addiction Treatment   [facility] Organization         Address  Phone  Notes  [facility]  8650870338   Domenic Schwab, PhD [address] [place], [place]   440-013-9548 or 980-449-3199   [place] [facility] [place], [place] [phone]   [facility] [address], [place], [place] 845-528-1198 Insurance/Medicaid/sponsorship through [facility] and Families [address]                                    [place], [place] 828-690-2098 [place] [address], [place] 816-877-6114    Dr. Adele Schilder  [phone]   [facility] Dept. 1) [address] [place] 2) [place] 3)  [place] [address], [place] [phone]  (225) [phone]   [place] 647-846-6367 or [phone] (After Hours)      Eat a bland diet, avoiding greasy, fatty, fried foods, as well as spicy and acidic foods or beverages.  Avoid eating within the hour or 2 before going to bed or laying down.  Also avoid teas, colas, coffee, chocolate, pepermint and spearment.  Take over the counter pepcid, one tablet by mouth twice a day, for the next 2 to 3 weeks.  May also take over the counter maalox/mylanta, as directed on packaging, as needed for discomfort.  Take the prescription as directed.  Call your regular medical doctor tomorrow to schedule a follow up appointment in the next 2 days.  Return to the Emergency Department immediately if worsening.

## 2013-12-29 NOTE — ED Notes (Signed)
Pt c/o epigastric pain that started suddenly around 1130 this morning.  Reports ate some chex mix around 0900 and a granola bar around 1030 this morning.  Denies vomiting but says feel a little nauseated.  Denies diarrhea.  LBM was yesterday but was small.  Reports pain feels like someone is "sqeezing my insides."  Denies chest pain.  Denies any SOB.

## 2013-12-29 NOTE — ED Notes (Signed)
Patient verbalizes understanding of discharge instructions, prescription medications, home care and follow up care. Patient ambulatory out of department at this time with family. 

## 2013-12-29 NOTE — ED Notes (Signed)
Pt given water for PO Challenge  

## 2013-12-29 NOTE — ED Provider Notes (Signed)
CSN: [id number]     Arrival date & time 12/29/13  1408 History   First MD Initiated Contact with Patient 12/29/13 1700     Chief Complaint  Patient presents with  . Abdominal Pain      HPI Pt was seen at 1715.  Per pt, c/o gradual onset and persistence of constant upper abd "pain" since this morning.  Has been associated with nausea. Describes the abd pain as "sharp." States the pain began shortly after eating a granola bar and Chex mix. Pt has not taken any meds for her symptoms. Denies vomiting/diarrhea, no fevers, no back pain, no rash, no CP/SOB, no black or blood in stools.       Past Medical History  Diagnosis Date  . Hypertension   . Hyperlipidemia   . PONV (postoperative nausea and vomiting)   . Anxiety   . Migraine     last migraine was 2 yrs ago   Past Surgical History  Procedure Laterality Date  . Sinus surgery with instatrak    . Tonsilectomy, adenoidectomy, bilateral myringotomy and tubes      pt says only had tonsils out, no adenoidectomy or myringotomy.  Serafina Royals abstraction    . Dilitation & currettage/hystroscopy with thermachoice ablation N/A 06/10/2012    Procedure: DILATATION & CURETTAGE/HYSTEROSCOPY WITH THERMACHOICE ABLATION (TOTAL THERAPY TIME=69min19sec) ;  Surgeon: Florian Buff, MD;  Location: [facility];  Service: Gynecology;  Laterality: N/A;  . Holmium laser application N/A 1/70/0174    Procedure: HOLMIUM LASER REMOVAL OF SUBMUCOSAL MYOMA;  Surgeon: Florian Buff, MD;  Location: [facility];  Service: Gynecology;  Laterality: N/A;   Family History  Problem Relation Age of Onset  . Hypertension Mother   . Diabetes Father   . Cancer Maternal Grandmother     breast cancer  . Cancer Maternal Grandfather     skin cancer   History  Substance Use Topics  . Smoking status: Never Smoker   . Smokeless tobacco: Not on file  . Alcohol Use: No    Review of Systems ROS: Statement: All systems negative except as marked or noted in the HPI; Constitutional:  Negative for fever and chills. ; ; Eyes: Negative for eye pain, redness and discharge. ; ; ENMT: Negative for ear pain, hoarseness, nasal congestion, sinus pressure and sore throat. ; ; Cardiovascular: Negative for chest pain, palpitations, diaphoresis, dyspnea and peripheral edema. ; ; Respiratory: Negative for cough, wheezing and stridor. ; ; Gastrointestinal: +nausea, abd pain. Negative for vomiting, diarrhea, blood in stool, hematemesis, jaundice and rectal bleeding. . ; ; Genitourinary: Negative for dysuria, flank pain and hematuria. ; ; Musculoskeletal: Negative for back pain and neck pain. Negative for swelling and trauma.; ; Skin: Negative for pruritus, rash, abrasions, blisters, bruising and skin lesion.; ; Neuro: Negative for headache, lightheadedness and neck stiffness. Negative for weakness, altered level of consciousness , altered mental status, extremity weakness, paresthesias, involuntary movement, seizure and syncope.      Allergies  Ace inhibitors and Oxycodone hcl  Home Medications   Prior to Admission medications   Medication Sig Start Date End Date Taking? Authorizing Provider  acetaminophen (TYLENOL) 500 MG tablet Take 500 mg by mouth every 6 (six) hours as needed for pain.     Historical Provider, MD  ALPRAZolam Duanne Moron) 0.5 MG tablet Take 0.5 mg by mouth 3 (three) times daily.    Historical Provider, MD  cholecalciferol (VITAMIN D) 1000 UNITS tablet Take 1,000 Units by mouth daily.  Historical Provider, MD  fish oil-omega-3 fatty acids 1000 MG capsule Take 2 g by mouth daily.    Historical Provider, MD  HYDROcodone-acetaminophen (NORCO/VICODIN) 5-325 MG per tablet Take 1 tablet by mouth every 6 (six) hours as needed for pain. 06/10/12   Florian Buff, MD  ibuprofen (ADVIL,MOTRIN) 200 MG tablet Take 400 mg by mouth every 6 (six) hours as needed for pain.     Historical Provider, MD  ketorolac (TORADOL) 10 MG tablet Take 1 tablet (10 mg total) by mouth every 8 (eight) hours as  needed for pain. 06/10/12   Florian Buff, MD  olmesartan-hydrochlorothiazide (BENICAR HCT) 40-25 MG per tablet Take 1 tablet by mouth daily.    Historical Provider, MD  ondansetron (ZOFRAN) 8 MG tablet Take 1 tablet (8 mg total) by mouth every 8 (eight) hours as needed for nausea. 06/10/12   Florian Buff, MD   BP 137/80 mmHg  Pulse 67  Temp(Src) 97.7 F (36.5 C) (Oral)  Resp 18  Ht 5' 4.5" (1.638 m)  Wt 200 lb (90.719 kg)  BMI 33.81 kg/m2  SpO2 100% Physical Exam  1720: Physical examination:  Nursing notes reviewed; Vital signs and O2 SAT reviewed;  Constitutional: Well developed, Well nourished, Well hydrated, In no acute distress; Head:  Normocephalic, atraumatic; Eyes: EOMI, PERRL, No scleral icterus; ENMT: Mouth and pharynx normal, Mucous membranes moist; Neck: Supple, Full range of motion, No lymphadenopathy; Cardiovascular: Regular rate and rhythm, No murmur, rub, or gallop; Respiratory: Breath sounds clear & equal bilaterally, No rales, rhonchi, wheezes.  Speaking full sentences with ease, Normal respiratory effort/excursion; Chest: Nontender, Movement normal; Abdomen: Soft, +LUQ and mid-epigastric tenderness to palp. No rebound or guarding. Nondistended, Normal bowel sounds; Genitourinary: No CVA tenderness; Extremities: Pulses normal, No tenderness, No edema, No calf edema or asymmetry.; Neuro: AA&Ox3, Major CN grossly intact.  Speech clear. No gross focal motor or sensory deficits in extremities.; Skin: Color normal, Warm, Dry.   ED Course  Procedures     EKG Interpretation None      MDM  MDM Reviewed: previous chart, nursing note and vitals Reviewed previous: labs Interpretation: labs and x-ray     Results for orders placed or performed during the hospital encounter of 12/29/13  CBC  Result Value Ref Range   WBC 7.8 4.0 - 10.5 K/uL   RBC 4.21 3.87 - 5.11 MIL/uL   Hemoglobin 13.1 12.0 - 15.0 g/dL   HCT 39.5 36.0 - 46.0 %   MCV 93.8 78.0 - 100.0 fL   MCH 31.1 26.0  - 34.0 pg   MCHC 33.2 30.0 - 36.0 g/dL   RDW 12.9 11.5 - 15.5 %   Platelets 274 150 - 400 K/uL  Basic metabolic panel  Result Value Ref Range   Sodium 137 137 - 147 mEq/L   Potassium 3.8 3.7 - 5.3 mEq/L   Chloride 99 96 - 112 mEq/L   CO2 27 19 - 32 mEq/L   Glucose, Bld 97 70 - 99 mg/dL   BUN 10 6 - 23 mg/dL   Creatinine, Ser 1.02 0.50 - 1.10 mg/dL   Calcium 9.5 8.4 - 10.5 mg/dL   GFR calc non Af Amer 64 (L) >90 mL/min   GFR calc Af Amer 74 (L) >90 mL/min   Anion gap 11 5 - 15  Pregnancy, urine  Result Value Ref Range   Preg Test, Ur NEGATIVE NEGATIVE  Urinalysis, Routine w reflex microscopic  Result Value Ref Range   Color, Urine  YELLOW YELLOW   APPearance CLEAR CLEAR   Specific Gravity, Urine 1.025 1.005 - 1.030   pH 5.0 5.0 - 8.0   Glucose, UA NEGATIVE NEGATIVE mg/dL   Hgb urine dipstick NEGATIVE NEGATIVE   Bilirubin Urine NEGATIVE NEGATIVE   Ketones, ur NEGATIVE NEGATIVE mg/dL   Protein, ur NEGATIVE NEGATIVE mg/dL   Urobilinogen, UA 0.2 0.0 - 1.0 mg/dL   Nitrite NEGATIVE NEGATIVE   Leukocytes, UA NEGATIVE NEGATIVE  Hepatic function panel  Result Value Ref Range   Total Protein 7.1 6.0 - 8.3 g/dL   Albumin 3.7 3.5 - 5.2 g/dL   AST 22 0 - 37 U/L   ALT 24 0 - 35 U/L   Alkaline Phosphatase 46 39 - 117 U/L   Total Bilirubin 0.9 0.3 - 1.2 mg/dL   Bilirubin, Direct <0.2 0.0 - 0.3 mg/dL   Indirect Bilirubin NOT CALCULATED 0.3 - 0.9 mg/dL  Lipase, blood  Result Value Ref Range   Lipase 14 11 - 59 U/L   Dg Abd Acute W/chest 12/29/2013   CLINICAL DATA:  Abdominal pain and nausea.  EXAM: ACUTE ABDOMEN SERIES (ABDOMEN 2 VIEW & CHEST 1 VIEW)  COMPARISON:  Chest x-rays dated 11/20/2009 and 11/18/2009  FINDINGS: There is no evidence of dilated bowel loops or free intraperitoneal air. No radiopaque calculi or other significant radiographic abnormality is seen. Heart size and mediastinal contours are within normal limits. Both lungs are clear.  IMPRESSION: Negative abdominal  radiographs.  No acute cardiopulmonary disease.   Electronically Signed   By: Rozetta Nunnery M.D.   On: 12/29/2013 18:11     1850:  Pt has tol PO well while in the ED without N/V.  No stooling while in the ED.  Abd benign, VSS. Feels better and wants to go home now. Dx and testing d/w pt and family.  Questions answered.  Verb understanding, agreeable to d/c home with outpt f/u.     Francine Graven, DO 01/02/14 [phone]

## 2014-11-03 ENCOUNTER — Other Ambulatory Visit (HOSPITAL_COMMUNITY): Payer: Self-pay | Admitting: Family Medicine

## 2014-11-03 DIAGNOSIS — Z1231 Encounter for screening mammogram for malignant neoplasm of breast: Secondary | ICD-10-CM

## 2014-11-28 ENCOUNTER — Ambulatory Visit (HOSPITAL_COMMUNITY)
Admission: RE | Admit: 2014-11-28 | Discharge: 2014-11-28 | Disposition: A | Discharge status: Home / Self Care | Payer: 59 | Source: Ambulatory Visit | Attending: Family Medicine | Admitting: Family Medicine

## 2014-11-28 DIAGNOSIS — Z1231 Encounter for screening mammogram for malignant neoplasm of breast: Secondary | ICD-10-CM

## 2015-07-11 ENCOUNTER — Other Ambulatory Visit (HOSPITAL_COMMUNITY): Payer: Self-pay | Admitting: Family Medicine

## 2015-07-11 ENCOUNTER — Ambulatory Visit (HOSPITAL_COMMUNITY)
Admission: RE | Admit: 2015-07-11 | Discharge: 2015-07-11 | Disposition: A | Discharge status: Home / Self Care | Payer: 59 | Source: Ambulatory Visit | Attending: Family Medicine | Admitting: Family Medicine

## 2015-07-11 DIAGNOSIS — R1031 Right lower quadrant pain: Secondary | ICD-10-CM | POA: Diagnosis present

## 2015-07-11 DIAGNOSIS — D259 Leiomyoma of uterus, unspecified: Secondary | ICD-10-CM | POA: Insufficient documentation

## 2015-07-11 MED ORDER — IOPAMIDOL (ISOVUE-300) INJECTION 61%
100.0000 mL | Freq: Once | INTRAVENOUS | Status: AC | PRN
  Administered 2015-07-11: 100 mL via INTRAVENOUS

## 2015-07-11 MED ORDER — DIATRIZOATE MEGLUMINE & SODIUM 66-10 % PO SOLN
ORAL | Status: AC
  Filled 2015-07-11: qty 30

## 2015-07-11 NOTE — Progress Notes (Signed)
Called operator at 2041 to see who was on call for Dr Hilma Favors. It is Dr Gerarda Fraction. We waited till 2120 and he never called [place] back. I went and told patient that the doctor on call had not called [place] back. I told her to call Dr Hilma Favors first thing in the morning to get her results.

## 2015-07-18 ENCOUNTER — Encounter: Payer: Self-pay | Admitting: Obstetrics & Gynecology

## 2015-07-18 ENCOUNTER — Ambulatory Visit (INDEPENDENT_AMBULATORY_CARE_PROVIDER_SITE_OTHER): Payer: 59 | Admitting: Obstetrics & Gynecology

## 2015-07-18 VITALS — BP 100/70 | HR 76 | Wt 252.0 lb

## 2015-07-18 DIAGNOSIS — R102 Pelvic and perineal pain: Secondary | ICD-10-CM

## 2015-07-18 DIAGNOSIS — D259 Leiomyoma of uterus, unspecified: Secondary | ICD-10-CM | POA: Diagnosis not present

## 2015-07-18 MED ORDER — TERCONAZOLE 0.4 % VA CREA: 1.0000 | TOPICAL_CREAM | Freq: Every day | VAGINAL | Status: DC

## 2015-07-18 NOTE — Progress Notes (Signed)
Preoperative History and Physical  Brittany Daniels is a 50 y.o. No obstetric history on file. with No LMP recorded. Patient has had an ablation. admitted for a supracervical hysterectomy due to the development of a large solitary uterine myoma casuing pain and pressure.    PMH:    Past Medical History  Diagnosis Date  . Hypertension   . Hyperlipidemia   . PONV (postoperative nausea and vomiting)   . Anxiety   . Migraine     last migraine was 2 yrs ago    PSH:     Past Surgical History  Procedure Laterality Date  . Sinus surgery with instatrak    . Tonsilectomy, adenoidectomy, bilateral myringotomy and tubes      pt says only had tonsils out, no adenoidectomy or myringotomy.  Serafina Royals abstraction    . Dilitation & currettage/hystroscopy with thermachoice ablation N/A 06/10/2012    Procedure: DILATATION & CURETTAGE/HYSTEROSCOPY WITH THERMACHOICE ABLATION (TOTAL THERAPY TIME=57min19sec) ;  Surgeon: Florian Buff, MD;  Location: [facility];  Service: Gynecology;  Laterality: N/A;  . Holmium laser application N/A 0000000    Procedure: HOLMIUM LASER REMOVAL OF SUBMUCOSAL MYOMA;  Surgeon: Florian Buff, MD;  Location: [facility];  Service: Gynecology;  Laterality: N/A;    POb/GynH:      OB History    No data available      SH:   Social History  Substance Use Topics  . Smoking status: Never Smoker   . Smokeless tobacco: None  . Alcohol Use: No    FH:    Family History  Problem Relation Age of Onset  . Hypertension Mother   . Diabetes Father   . Cancer Maternal Grandmother     breast cancer  . Cancer Maternal Grandfather     skin cancer     Allergies:  Allergies  Allergen Reactions  . Ace Inhibitors Cough  . Oxycodone Hcl Itching    Medications:       Current outpatient prescriptions:  .  cholecalciferol (VITAMIN D) 1000 UNITS tablet, Take 1,000 Units by mouth daily., Disp: , Rfl:  .  ondansetron (ZOFRAN) 4 MG tablet, Take 1 tablet (4 mg total) by mouth every 8 (eight)  hours as needed for nausea or vomiting., Disp: 6 tablet, Rfl: 0 .  fish oil-omega-3 fatty acids 1000 MG capsule, Take 2 g by mouth daily. Reported on 07/18/2015, Disp: , Rfl:  .  terconazole (TERAZOL 7) 0.4 % vaginal cream, Place 1 applicator vaginally at bedtime., Disp: 45 g, Rfl: 0  Review of Systems:   Review of Systems  Constitutional: Negative for fever, chills, weight loss, malaise/fatigue and diaphoresis.  HENT: Negative for hearing loss, ear pain, nosebleeds, congestion, sore throat, neck pain, tinnitus and ear discharge.   Eyes: Negative for blurred vision, double vision, photophobia, pain, discharge and redness.  Respiratory: Negative for cough, hemoptysis, sputum production, shortness of breath, wheezing and stridor.   Cardiovascular: Negative for chest pain, palpitations, orthopnea, claudication, leg swelling and PND.  Gastrointestinal: Positive for abdominal pain. Negative for heartburn, nausea, vomiting, diarrhea, constipation, blood in stool and melena.  Genitourinary: Negative for dysuria, urgency, frequency, hematuria and flank pain.  Musculoskeletal: Negative for myalgias, back pain, joint pain and falls.  Skin: Negative for itching and rash.  Neurological: Negative for dizziness, tingling, tremors, sensory change, speech change, focal weakness, seizures, loss of consciousness, weakness and headaches.  Endo/Heme/Allergies: Negative for environmental allergies and polydipsia. Does not bruise/bleed easily.  Psychiatric/Behavioral: Negative for depression,  suicidal ideas, hallucinations, memory loss and substance abuse. The patient is not nervous/anxious and does not have insomnia.      PHYSICAL EXAM:  Blood pressure 100/70, pulse 76, weight 252 lb (114.306 kg).    Vitals reviewed. Constitutional: She is oriented to person, place, and time. She appears well-developed and well-nourished.  HENT:  Head: Normocephalic and atraumatic.  Right Ear: External ear normal.  Left  Ear: External ear normal.  Nose: Nose normal.  Mouth/Throat: Oropharynx is clear and moist.  Eyes: Conjunctivae and EOM are normal. Pupils are equal, round, and reactive to light. Right eye exhibits no discharge. Left eye exhibits no discharge. No scleral icterus.  Neck: Normal range of motion. Neck supple. No tracheal deviation present. No thyromegaly present.  Cardiovascular: Normal rate, regular rhythm, normal heart sounds and intact distal pulses.  Exam reveals no gallop and no friction rub.   No murmur heard. Respiratory: Effort normal and breath sounds normal. No respiratory distress. She has no wheezes. She has no rales. She exhibits no tenderness.  GI: Soft. Bowel sounds are normal. She exhibits no distension and no mass. There is tenderness. There is no rebound and no guarding.  Genitourinary:       Vulva is normal without lesions Vagina is pink moist without discharge Cervix normal in appearance and pap is normal Uterus is 16 week size Adnexa is negative with normal sized ovaries by sonogram  Musculoskeletal: Normal range of motion. She exhibits no edema and no tenderness.  Neurological: She is alert and oriented to person, place, and time. She has normal reflexes. She displays normal reflexes. No cranial nerve deficit. She exhibits normal muscle tone. Coordination normal.  Skin: Skin is warm and dry. No rash noted. No erythema. No pallor.  Psychiatric: She has a normal mood and affect. Her behavior is normal. Judgment and thought content normal.    Labs: No results found for this or any previous visit (from the past 336 hour(s)).  EKG: Orders placed or performed during the hospital encounter of 12/29/13  . ED EKG  . ED EKG  . EKG    Imaging Studies: Ct Abdomen Pelvis W Contrast  07/11/2015  CLINICAL DATA:  Acute onset of right lower quadrant and mid abdominal pain. Severe nausea and vomiting. Initial encounter. EXAM: CT ABDOMEN AND PELVIS WITH CONTRAST TECHNIQUE:  Multidetector CT imaging of the abdomen and pelvis was performed using the standard protocol following bolus administration of intravenous contrast. CONTRAST:  137mL ISOVUE-300 IOPAMIDOL (ISOVUE-300) INJECTION 61% COMPARISON:  CT of the abdomen and pelvis from 11/18/2009 FINDINGS: The visualized lung bases are clear. The liver and spleen are unremarkable in appearance. The gallbladder is decompressed and grossly unremarkable. The pancreas and adrenal glands are unremarkable. The kidneys are unremarkable in appearance. There is no evidence of hydronephrosis. No renal or ureteral stones are seen. No perinephric stranding is appreciated. No free fluid is identified. The small bowel is unremarkable in appearance. The stomach is within normal limits. No acute vascular abnormalities are seen. Minimal calcification is seen along the abdominal aorta. The appendix is normal in caliber, without evidence of appendicitis. Contrast progresses to the level of the rectum. The colon is unremarkable in appearance. The bladder is mildly distended and grossly unremarkable. A large uterine fibroid is seen, measuring approximately 12.1 cm. The ovaries are grossly symmetric. No suspicious adnexal masses are seen. No inguinal lymphadenopathy is seen. No acute osseous abnormalities are identified. IMPRESSION: 1. No acute abnormality seen within the abdomen or pelvis. 2.  Large uterine fibroid, measuring 12.1 cm, new from 2011. If undergoing mild degeneration, this could correspond to the patient's symptoms. Electronically Signed   By: Garald Balding M.D.   On: 07/11/2015 20:39      Assessment: Uterine leiomyoma, unspecified location  Pelvic pain in female   Patient Active Problem List   Diagnosis Date Noted  . Hyperlipidemia 04/30/2012  . Anxiety 04/30/2012    Plan: Supracervical hysterectomy with removal of both tubes and ovaries  EURE,LUTHER H 07/18/2015 4:22 PM

## 2015-07-19 NOTE — Patient Instructions (Addendum)
Brittany Daniels  07/19/2015     @PREFPERIOPPHARMACY @   Your procedure is scheduled on 07/26/2015.  Report to [facility] at 7:10 A.M.  Call this number if you have problems the morning of surgery:  [phone]   Remember:  Do not eat food or drink liquids after midnight.  Take these medicines the morning of surgery with A SIP OF WATER  Xanax, lexapro, cozaar, zofran.   Do not wear jewelry, make-up or nail polish.  Do not wear lotions, powders, or perfumes.  You may wear deoderant.  Do not shave 48 hours prior to surgery.  Men may shave face and neck.  Do not bring valuables to the hospital.  [facility] is not responsible for any belongings or valuables.  Contacts, dentures or bridgework may not be worn into surgery.  Leave your suitcase in the car.  After surgery it may be brought to your room.  For patients admitted to the hospital, discharge time will be determined by your treatment team.  Patients discharged the day of surgery will not be allowed to drive home.    Please read over the following fact sheets that you were given. Surgical Site Infection Prevention and Anesthesia Post-op Instructions     PATIENT INSTRUCTIONS POST-ANESTHESIA  IMMEDIATELY FOLLOWING SURGERY:  Do not drive or operate machinery for the first twenty four hours after surgery.  Do not make any important decisions for twenty four hours after surgery or while taking narcotic pain medications or sedatives.  If you develop intractable nausea and vomiting or a severe headache please notify your doctor immediately.  FOLLOW-UP:  Please make an appointment with your surgeon as instructed. You do not need to follow up with anesthesia unless specifically instructed to do so.  WOUND CARE INSTRUCTIONS (if applicable):  Keep a dry clean dressing on the anesthesia/puncture wound site if there is drainage.  Once the wound has quit draining you may leave it open to air.  Generally you should leave the bandage intact  for twenty four hours unless there is drainage.  If the epidural site drains for more than 36-48 hours please call the anesthesia department.  QUESTIONS?:  Please feel free to call your physician or the hospital operator if you have any questions, and they will be happy to assist you.      Bilateral Salpingo-Oophorectomy Bilateral salpingo-oophorectomy is the surgical removal of both fallopian tubes and both ovaries. The ovaries are small organs that produce eggs in women. The fallopian tubes transport the egg from the ovary to the womb (uterus). Usually, when this surgery is done, the uterus was previously removed. A bilateral salpingo-oophorectomy may be done to treat cancer or to reduce the risk of cancer in women who are at high risk. Removing both fallopian tubes and both ovaries will make you unable to become pregnant (sterile). It will also put you into menopause so that you will no longer have menstrual periods and may have menopausal symptoms such as hot flashes, night sweats, and mood changes. It will not affect your sex drive. LET [facility] CARE PROVIDER KNOW ABOUT:  Any allergies you have.  All medicines you are taking, including vitamins, herbs, eye drops, creams, and over-the-counter medicines.  Previous problems you or members of your family have had with the use of anesthetics.  Any blood disorders you have.  Previous surgeries you have had.  Medical conditions you have. RISKS AND COMPLICATIONS Generally, this is a safe procedure. However, as with any procedure,  complications can occur. Possible complications include:  Injury to surrounding organs.  Bleeding.  Infection.  Blood clots in the legs or lungs.  Problems related to anesthesia. BEFORE THE PROCEDURE  Ask your health care provider about changing or stopping your regular medicines. You may need to stop taking certain medicines, such as aspirin or blood thinners, at least 1 week before the surgery.  Do not  eat or drink anything for at least 8 hours before the surgery.  If you smoke, do not smoke for at least 2 weeks before the surgery.  Make plans to have someone drive you home after the procedure or after your hospital stay. Also arrange for someone to help you with activities during recovery. PROCEDURE   You will be given medicine to help you relax before the procedure (sedative). You will then be given medicine to make you sleep through the procedure (general anesthetic). These medicines will be given through an IV access tube that is put into one of your veins.  Once you are asleep, your lower abdomen will be shaved and cleaned. A thin, flexible tube (catheter) will be placed in your bladder.  The surgeon may use a laparoscopic, robotic, or open technique for this surgery:  In the laparoscopic technique, the surgery is done through two small cuts (incisions) in the abdomen. A thin, lighted tube with a tiny camera on the end (laparoscope) is inserted into one of the incisions. The tools needed for the procedure are put through the other incision.  A robotic technique may be chosen to perform complex surgery in a small space. In the robotic technique, small incisions will be made. A camera and surgical instruments are passed through the incisions. Surgical instruments will be controlled with the help of a robotic arm.  In the open technique, the surgery is done through one large incision in the abdomen.  Using any of these techniques, the surgeon removes the fallopian tubes and ovaries. The blood vessels will be clamped and tied.  The surgeon then uses staples or stitches to close the incision or incisions. AFTER THE PROCEDURE  You will be taken to a recovery area where you will be monitored for 1 to 3 hours. Your blood pressure, pulse, and temperature will be checked often. You will remain in the recovery area until you are stable and waking up.  If the laparoscopic technique was used, you  may be allowed to go home after several hours. You may have some shoulder pain after the laparoscopic procedure. This is normal and usually goes away in a day or two.  If the open technique was used, you will be admitted to the hospital for a couple of days.  You will be given pain medicine as needed.  The IV access tube and catheter will be removed before you are discharged.   This information is not intended to replace advice given to you by your health care provider. Make sure you discuss any questions you have with your health care provider.   Document Released: 01/07/2005 Document Revised: 01/12/2013 Document Reviewed: 07/01/2012 Elsevier Interactive Patient Education 2016 [place] Hysterectomy A supracervical hysterectomy is surgery to remove the top part of the uterus, but not the cervix. You will no longer have menstrual periods or be able to get pregnant after this surgery. The fallopian tubes and ovaries may also be removed (bilateral salpingo-oophorectomy) during this surgery. This surgery is usually performed using a minimally invasive technique called laparoscopy. This technique allows the surgery  to be done through small incisions. The minimally invasive technique provides benefits such as less pain, less risk of infection, and shorter recovery time. LET [facility] CARE PROVIDER KNOW ABOUT:  Any allergies you have.  All medicines you are taking, including vitamins, herbs, eye drops, creams, and over-the-counter medicines.  Previous problems you or members of your family have had with the use of anesthetics.  Any blood disorders you have.  Previous surgeries you have had.  Medical conditions you have. RISKS AND COMPLICATIONS  Generally, this is a safe procedure. However, as with any procedure, complications can occur. Possible complications include:  Bleeding.  Blood clots in the legs or lung.  Infection.  Injury to surrounding  organs.  Problems related to anesthesia.  Conversion to an open abdominal surgery.  Additional surgery later to remove the cervix if you have problems with the cervix. BEFORE THE PROCEDURE  Ask your health care provider about changing or stopping your regular medicines.  Do not take aspirin or blood thinners (anticoagulants) for 1 week before the surgery, or as directed by your health care provider.  Do not eat or drink anything for 8 hours before the surgery, or as directed by your health care provider.  Quit smoking if you smoke.  Arrange for a ride home after surgery and for someone to help you at home during recovery. PROCEDURE   You will be given an antibiotic medicine.  An IV tube will be placed in one of your veins. You will be given medicine to make you sleep (general anesthetic).  A gas (carbon dioxide) will be used to inflate your abdomen. This will allow your surgeon to look inside your abdomen, perform your surgery, and treat any other problems found if necessary.  Three or four small incisions will be made in your abdomen. One of these incisions will be made in the area of your belly button (navel). A thin, flexible tube with a tiny camera and light on the end of it (laparoscope) will be inserted into the incision. The camera on the laparoscope sends a picture to a TV screen in the operating room. This gives your surgeon a good view inside the abdomen.  Other surgical instruments will be inserted through the other incisions.  The uterus will be cut into small pieces and removed through the small incisions.  Your incisions will be closed. AFTER THE PROCEDURE   You will be taken to a recovery area where your progress will be monitored until you are awake, stable, and taking fluids well. If there are no other problems, you will then be moved to a regular hospital room, or you will be allowed to go home.  You will likely have minimal discomfort after the surgery because  the incisions are so small with the laparoscopic technique.  You will be given pain medicine while you are in the hospital and for when you go home.  If a bilateral salpingo-oophorectomy was performed before menopause, you will go through a sudden (abrupt) menopause. This can be helped with hormone medicines.   This information is not intended to replace advice given to you by your health care provider. Make sure you discuss any questions you have with your health care provider.   Document Released: 06/26/2007 Document Revised: 10/28/2012 Document Reviewed: 07/10/2012 Elsevier Interactive Patient Education [employer].

## 2015-07-20 ENCOUNTER — Other Ambulatory Visit: Payer: Self-pay | Admitting: Obstetrics & Gynecology

## 2015-07-21 ENCOUNTER — Encounter (HOSPITAL_COMMUNITY): Payer: Self-pay

## 2015-07-21 ENCOUNTER — Encounter (HOSPITAL_COMMUNITY)
Admission: RE | Admit: 2015-07-21 | Discharge: 2015-07-21 | Disposition: A | Discharge status: Home / Self Care | Payer: 59 | Source: Ambulatory Visit | Attending: Obstetrics & Gynecology | Admitting: Obstetrics & Gynecology

## 2015-07-21 DIAGNOSIS — R9431 Abnormal electrocardiogram [ECG] [EKG]: Secondary | ICD-10-CM | POA: Diagnosis not present

## 2015-07-21 DIAGNOSIS — Z01812 Encounter for preprocedural laboratory examination: Secondary | ICD-10-CM | POA: Insufficient documentation

## 2015-07-21 DIAGNOSIS — Z01810 Encounter for preprocedural cardiovascular examination: Secondary | ICD-10-CM | POA: Diagnosis present

## 2015-07-21 DIAGNOSIS — D219 Benign neoplasm of connective and other soft tissue, unspecified: Secondary | ICD-10-CM | POA: Diagnosis not present

## 2015-07-21 LAB — TYPE AND SCREEN
ABO/RH(D): A POS
Antibody Screen: NEGATIVE

## 2015-07-21 LAB — COMPREHENSIVE METABOLIC PANEL
ALK PHOS: 41 U/L (ref 38–126)
ALT: 20 U/L (ref 14–54)
AST: 20 U/L (ref 15–41)
Albumin: 3.7 g/dL (ref 3.5–5.0)
Anion gap: 6 (ref 5–15)
BUN: 14 mg/dL (ref 6–20)
CALCIUM: 9.2 mg/dL (ref 8.9–10.3)
CO2: 26 mmol/L (ref 22–32)
CREATININE: 1 mg/dL (ref 0.44–1.00)
Chloride: 105 mmol/L (ref 101–111)
Glucose, Bld: 100 mg/dL — ABNORMAL HIGH (ref 65–99)
Potassium: 4 mmol/L (ref 3.5–5.1)
Sodium: 137 mmol/L (ref 135–145)
Total Bilirubin: 0.8 mg/dL (ref 0.3–1.2)
Total Protein: 6.5 g/dL (ref 6.5–8.1)

## 2015-07-21 LAB — URINALYSIS, ROUTINE W REFLEX MICROSCOPIC
BILIRUBIN URINE: NEGATIVE
GLUCOSE, UA: NEGATIVE mg/dL
HGB URINE DIPSTICK: NEGATIVE
Ketones, ur: NEGATIVE mg/dL
Leukocytes, UA: NEGATIVE
Nitrite: NEGATIVE
Protein, ur: NEGATIVE mg/dL
SPECIFIC GRAVITY, URINE: 1.015 (ref 1.005–1.030)
pH: 7.5 (ref 5.0–8.0)

## 2015-07-21 LAB — CBC
HCT: 41.6 % (ref 36.0–46.0)
HEMOGLOBIN: 14.2 g/dL (ref 12.0–15.0)
MCH: 31.7 pg (ref 26.0–34.0)
MCHC: 34.1 g/dL (ref 30.0–36.0)
MCV: 92.9 fL (ref 78.0–100.0)
Platelets: 252 10*3/uL (ref 150–400)
RBC: 4.48 MIL/uL (ref 3.87–5.11)
RDW: 13.1 % (ref 11.5–15.5)
WBC: 6.5 10*3/uL (ref 4.0–10.5)

## 2015-07-21 LAB — HCG, QUANTITATIVE, PREGNANCY: HCG, BETA CHAIN, QUANT, S: 1 m[IU]/mL (ref <5)

## 2015-07-21 NOTE — Pre-Procedure Instructions (Signed)
Patient given information to sign up for my chart at home. 

## 2015-07-26 ENCOUNTER — Encounter (HOSPITAL_COMMUNITY)
Admission: RE | Disposition: A | Discharge status: Home / Self Care | Payer: Self-pay | Source: Ambulatory Visit | Attending: Obstetrics & Gynecology

## 2015-07-26 ENCOUNTER — Inpatient Hospital Stay (HOSPITAL_COMMUNITY): Payer: 59 | Admitting: Anesthesiology

## 2015-07-26 ENCOUNTER — Encounter (HOSPITAL_COMMUNITY): Payer: Self-pay

## 2015-07-26 ENCOUNTER — Observation Stay (HOSPITAL_COMMUNITY)
Admission: RE | Admit: 2015-07-26 | Discharge: 2015-07-27 | Disposition: A | Discharge status: Home / Self Care | Payer: 59 | Source: Ambulatory Visit | Attending: Obstetrics & Gynecology | Admitting: Obstetrics & Gynecology

## 2015-07-26 DIAGNOSIS — D252 Subserosal leiomyoma of uterus: Secondary | ICD-10-CM | POA: Diagnosis not present

## 2015-07-26 DIAGNOSIS — K219 Gastro-esophageal reflux disease without esophagitis: Secondary | ICD-10-CM | POA: Insufficient documentation

## 2015-07-26 DIAGNOSIS — R102 Pelvic and perineal pain: Secondary | ICD-10-CM | POA: Diagnosis not present

## 2015-07-26 DIAGNOSIS — N838 Other noninflammatory disorders of ovary, fallopian tube and broad ligament: Secondary | ICD-10-CM | POA: Insufficient documentation

## 2015-07-26 DIAGNOSIS — Z87891 Personal history of nicotine dependence: Secondary | ICD-10-CM | POA: Insufficient documentation

## 2015-07-26 DIAGNOSIS — G43909 Migraine, unspecified, not intractable, without status migrainosus: Secondary | ICD-10-CM | POA: Diagnosis not present

## 2015-07-26 DIAGNOSIS — Z833 Family history of diabetes mellitus: Secondary | ICD-10-CM | POA: Diagnosis not present

## 2015-07-26 DIAGNOSIS — R109 Unspecified abdominal pain: Secondary | ICD-10-CM | POA: Insufficient documentation

## 2015-07-26 DIAGNOSIS — Z888 Allergy status to other drugs, medicaments and biological substances status: Secondary | ICD-10-CM | POA: Insufficient documentation

## 2015-07-26 DIAGNOSIS — Z803 Family history of malignant neoplasm of breast: Secondary | ICD-10-CM | POA: Insufficient documentation

## 2015-07-26 DIAGNOSIS — F419 Anxiety disorder, unspecified: Secondary | ICD-10-CM | POA: Diagnosis not present

## 2015-07-26 DIAGNOSIS — Z6841 Body Mass Index (BMI) 40.0 and over, adult: Secondary | ICD-10-CM | POA: Diagnosis not present

## 2015-07-26 DIAGNOSIS — Z885 Allergy status to narcotic agent status: Secondary | ICD-10-CM | POA: Diagnosis not present

## 2015-07-26 DIAGNOSIS — Z808 Family history of malignant neoplasm of other organs or systems: Secondary | ICD-10-CM | POA: Diagnosis not present

## 2015-07-26 DIAGNOSIS — Z8249 Family history of ischemic heart disease and other diseases of the circulatory system: Secondary | ICD-10-CM | POA: Diagnosis not present

## 2015-07-26 DIAGNOSIS — D259 Leiomyoma of uterus, unspecified: Principal | ICD-10-CM | POA: Insufficient documentation

## 2015-07-26 DIAGNOSIS — I10 Essential (primary) hypertension: Secondary | ICD-10-CM | POA: Insufficient documentation

## 2015-07-26 DIAGNOSIS — Z90711 Acquired absence of uterus with remaining cervical stump: Secondary | ICD-10-CM | POA: Diagnosis present

## 2015-07-26 DIAGNOSIS — E785 Hyperlipidemia, unspecified: Secondary | ICD-10-CM | POA: Insufficient documentation

## 2015-07-26 DIAGNOSIS — Z79899 Other long term (current) drug therapy: Secondary | ICD-10-CM | POA: Insufficient documentation

## 2015-07-26 HISTORY — PX: SUPRACERVICAL ABDOMINAL HYSTERECTOMY: SHX5393

## 2015-07-26 HISTORY — PX: SALPINGOOPHORECTOMY: SHX82

## 2015-07-26 SURGERY — HYSTERECTOMY, SUPRACERVICAL, ABDOMINAL
Anesthesia: General

## 2015-07-26 MED ORDER — KCL IN DEXTROSE-NACL 20-5-0.45 MEQ/L-%-% IV SOLN
INTRAVENOUS | Status: DC
Start: 2015-07-26 — End: 2015-07-27
  Administered 2015-07-26: 13:00:00 via INTRAVENOUS

## 2015-07-26 MED ORDER — BISACODYL 10 MG RE SUPP: 10.0000 mg | Freq: Every day | RECTAL | Status: DC | PRN

## 2015-07-26 MED ORDER — HYDROCHLOROTHIAZIDE 25 MG PO TABS
25.0000 mg | ORAL_TABLET | Freq: Every day | ORAL | Status: DC
  Administered 2015-07-27: 25 mg via ORAL
  Filled 2015-07-26: qty 1

## 2015-07-26 MED ORDER — ATROPINE SULFATE 0.4 MG/ML IJ SOLN
INTRAMUSCULAR | Status: AC
  Filled 2015-07-26: qty 2

## 2015-07-26 MED ORDER — MIDAZOLAM HCL 2 MG/2ML IJ SOLN
1.0000 mg | INTRAMUSCULAR | Status: DC | PRN
  Administered 2015-07-26 (×2): 2 mg via INTRAVENOUS
  Filled 2015-07-26: qty 2

## 2015-07-26 MED ORDER — BUPIVACAINE LIPOSOME 1.3 % IJ SUSP
INTRAMUSCULAR | Status: AC
  Filled 2015-07-26: qty 20

## 2015-07-26 MED ORDER — SUCCINYLCHOLINE CHLORIDE 20 MG/ML IJ SOLN
INTRAMUSCULAR | Status: AC
  Filled 2015-07-26: qty 1

## 2015-07-26 MED ORDER — LIDOCAINE HCL (PF) 1 % IJ SOLN
INTRAMUSCULAR | Status: AC
  Filled 2015-07-26: qty 5

## 2015-07-26 MED ORDER — DEXAMETHASONE SODIUM PHOSPHATE 4 MG/ML IJ SOLN
INTRAMUSCULAR | Status: AC
  Filled 2015-07-26: qty 1

## 2015-07-26 MED ORDER — HEMOSTATIC AGENTS (NO CHARGE) OPTIME
TOPICAL | Status: DC | PRN
  Administered 2015-07-26: 1 via TOPICAL

## 2015-07-26 MED ORDER — ALUM & MAG HYDROXIDE-SIMETH 200-200-20 MG/5ML PO SUSP: 30.0000 mL | ORAL | Status: DC | PRN

## 2015-07-26 MED ORDER — SENNOSIDES-DOCUSATE SODIUM 8.6-50 MG PO TABS: 1.0000 | ORAL_TABLET | Freq: Every evening | ORAL | Status: DC | PRN

## 2015-07-26 MED ORDER — ONDANSETRON HCL 4 MG/2ML IJ SOLN
4.0000 mg | Freq: Once | INTRAMUSCULAR | Status: AC | PRN
  Administered 2015-07-26: 4 mg via INTRAVENOUS
  Filled 2015-07-26: qty 2

## 2015-07-26 MED ORDER — DOCUSATE SODIUM 100 MG PO CAPS
100.0000 mg | ORAL_CAPSULE | Freq: Two times a day (BID) | ORAL | Status: DC
  Administered 2015-07-26 – 2015-07-27 (×2): 100 mg via ORAL
  Filled 2015-07-26 (×2): qty 1

## 2015-07-26 MED ORDER — ALPRAZOLAM 0.5 MG PO TABS: 0.5000 mg | ORAL_TABLET | Freq: Three times a day (TID) | ORAL | Status: DC | PRN

## 2015-07-26 MED ORDER — MIDAZOLAM HCL 2 MG/2ML IJ SOLN
INTRAMUSCULAR | Status: AC
  Filled 2015-07-26: qty 2

## 2015-07-26 MED ORDER — ONDANSETRON HCL 4 MG/2ML IJ SOLN
4.0000 mg | Freq: Once | INTRAMUSCULAR | Status: AC
  Administered 2015-07-26: 4 mg via INTRAVENOUS

## 2015-07-26 MED ORDER — MIDAZOLAM HCL 5 MG/5ML IJ SOLN
INTRAMUSCULAR | Status: DC | PRN
  Administered 2015-07-26: 2 mg via INTRAVENOUS

## 2015-07-26 MED ORDER — ATROPINE SULFATE 0.4 MG/ML IJ SOLN
INTRAMUSCULAR | Status: DC | PRN
  Administered 2015-07-26 (×2): 0.4 mg via INTRAVENOUS

## 2015-07-26 MED ORDER — FENTANYL CITRATE (PF) 100 MCG/2ML IJ SOLN: 25.0000 ug | INTRAMUSCULAR | Status: DC | PRN

## 2015-07-26 MED ORDER — HYDROMORPHONE HCL 1 MG/ML IJ SOLN
1.0000 mg | INTRAMUSCULAR | Status: DC | PRN
Start: 2015-07-26 — End: 2015-07-27
  Administered 2015-07-26: 2 mg via INTRAVENOUS
  Filled 2015-07-26: qty 1
  Filled 2015-07-26: qty 2

## 2015-07-26 MED ORDER — DEXAMETHASONE SODIUM PHOSPHATE 4 MG/ML IJ SOLN
4.0000 mg | Freq: Once | INTRAMUSCULAR | Status: AC
  Administered 2015-07-26: 4 mg via INTRAVENOUS

## 2015-07-26 MED ORDER — KETOROLAC TROMETHAMINE 30 MG/ML IJ SOLN
30.0000 mg | Freq: Once | INTRAMUSCULAR | Status: AC
  Administered 2015-07-26: 30 mg via INTRAVENOUS
  Filled 2015-07-26: qty 1

## 2015-07-26 MED ORDER — 0.9 % SODIUM CHLORIDE (POUR BTL) OPTIME
TOPICAL | Status: DC | PRN
  Administered 2015-07-26: 2000 mL

## 2015-07-26 MED ORDER — MENTHOL 3 MG MT LOZG: 1.0000 | LOZENGE | OROMUCOSAL | Status: DC | PRN

## 2015-07-26 MED ORDER — BUPIVACAINE LIPOSOME 1.3 % IJ SUSP
20.0000 mL | Freq: Once | INTRAMUSCULAR | Status: DC
  Filled 2015-07-26: qty 20

## 2015-07-26 MED ORDER — ROCURONIUM BROMIDE 100 MG/10ML IV SOLN
INTRAVENOUS | Status: DC | PRN
  Administered 2015-07-26: 10 mg via INTRAVENOUS
  Administered 2015-07-26: 35 mg via INTRAVENOUS
  Administered 2015-07-26: 20 mg via INTRAVENOUS
  Administered 2015-07-26: 5 mg via INTRAVENOUS

## 2015-07-26 MED ORDER — FENTANYL CITRATE (PF) 250 MCG/5ML IJ SOLN
INTRAMUSCULAR | Status: AC
  Filled 2015-07-26: qty 5

## 2015-07-26 MED ORDER — ARTIFICIAL TEARS OP OINT
TOPICAL_OINTMENT | OPHTHALMIC | Status: AC
  Filled 2015-07-26: qty 3.5

## 2015-07-26 MED ORDER — NEOSTIGMINE METHYLSULFATE 10 MG/10ML IV SOLN
INTRAVENOUS | Status: DC | PRN
  Administered 2015-07-26: 2 mg via INTRAVENOUS
  Administered 2015-07-26: 1 mg via INTRAVENOUS

## 2015-07-26 MED ORDER — EPHEDRINE SULFATE 50 MG/ML IJ SOLN
INTRAMUSCULAR | Status: AC
  Filled 2015-07-26: qty 1

## 2015-07-26 MED ORDER — GLYCOPYRROLATE 0.2 MG/ML IJ SOLN
INTRAMUSCULAR | Status: AC
  Filled 2015-07-26: qty 3

## 2015-07-26 MED ORDER — ESCITALOPRAM OXALATE 10 MG PO TABS
10.0000 mg | ORAL_TABLET | Freq: Every day | ORAL | Status: DC
  Administered 2015-07-27: 10 mg via ORAL
  Filled 2015-07-26: qty 1

## 2015-07-26 MED ORDER — FENTANYL CITRATE (PF) 100 MCG/2ML IJ SOLN
INTRAMUSCULAR | Status: DC | PRN
  Administered 2015-07-26 (×2): 100 ug via INTRAVENOUS
  Administered 2015-07-26 (×6): 50 ug via INTRAVENOUS

## 2015-07-26 MED ORDER — CEFAZOLIN SODIUM-DEXTROSE 2-4 GM/100ML-% IV SOLN
2.0000 g | INTRAVENOUS | Status: AC
  Administered 2015-07-26: 2 g via INTRAVENOUS
  Filled 2015-07-26: qty 100

## 2015-07-26 MED ORDER — SODIUM CHLORIDE 0.9 % IV SOLN
8.0000 mg | Freq: Four times a day (QID) | INTRAVENOUS | Status: DC | PRN
  Administered 2015-07-26: 8 mg via INTRAVENOUS
  Filled 2015-07-26 (×3): qty 4

## 2015-07-26 MED ORDER — SCOPOLAMINE 1 MG/3DAYS TD PT72
MEDICATED_PATCH | TRANSDERMAL | Status: AC
  Filled 2015-07-26: qty 1

## 2015-07-26 MED ORDER — GLYCOPYRROLATE 0.2 MG/ML IJ SOLN
INTRAMUSCULAR | Status: DC | PRN
  Administered 2015-07-26: 0.6 mg via INTRAVENOUS

## 2015-07-26 MED ORDER — ONDANSETRON HCL 4 MG PO TABS: 8.0000 mg | ORAL_TABLET | Freq: Four times a day (QID) | ORAL | Status: DC | PRN

## 2015-07-26 MED ORDER — LOSARTAN POTASSIUM 50 MG PO TABS
100.0000 mg | ORAL_TABLET | Freq: Every day | ORAL | Status: DC
  Administered 2015-07-27: 100 mg via ORAL
  Filled 2015-07-26: qty 2

## 2015-07-26 MED ORDER — ROCURONIUM BROMIDE 50 MG/5ML IV SOLN
INTRAVENOUS | Status: AC
  Filled 2015-07-26: qty 1

## 2015-07-26 MED ORDER — SODIUM CHLORIDE 0.9 % IJ SOLN
INTRAMUSCULAR | Status: AC
  Filled 2015-07-26: qty 10

## 2015-07-26 MED ORDER — ONDANSETRON HCL 4 MG/2ML IJ SOLN
INTRAMUSCULAR | Status: AC
  Filled 2015-07-26: qty 2

## 2015-07-26 MED ORDER — ZOLPIDEM TARTRATE 5 MG PO TABS: 5.0000 mg | ORAL_TABLET | Freq: Every evening | ORAL | Status: DC | PRN

## 2015-07-26 MED ORDER — EPHEDRINE SULFATE 50 MG/ML IJ SOLN
INTRAMUSCULAR | Status: DC | PRN
  Administered 2015-07-26: 10 mg via INTRAVENOUS

## 2015-07-26 MED ORDER — HYDROMORPHONE HCL 2 MG PO TABS
2.0000 mg | ORAL_TABLET | ORAL | Status: DC | PRN
  Administered 2015-07-26 (×2): 2 mg via ORAL
  Administered 2015-07-27: 4 mg via ORAL
  Administered 2015-07-27: 2 mg via ORAL
  Administered 2015-07-27: 4 mg via ORAL
  Administered 2015-07-27: 2 mg via ORAL
  Filled 2015-07-26 (×5): qty 1
  Filled 2015-07-26: qty 2
  Filled 2015-07-26: qty 1

## 2015-07-26 MED ORDER — PROMETHAZINE HCL 25 MG/ML IJ SOLN
6.2500 mg | INTRAMUSCULAR | Status: DC | PRN
Start: 2015-07-26 — End: 2015-07-26
  Administered 2015-07-26: 6.25 mg via INTRAVENOUS

## 2015-07-26 MED ORDER — SCOPOLAMINE 1 MG/3DAYS TD PT72
1.0000 | MEDICATED_PATCH | Freq: Once | TRANSDERMAL | Status: DC
  Administered 2015-07-26: 1.5 mg via TRANSDERMAL

## 2015-07-26 MED ORDER — LACTATED RINGERS IV SOLN
INTRAVENOUS | Status: DC
  Administered 2015-07-26: 08:00:00 via INTRAVENOUS
  Administered 2015-07-26: 1000 mL via INTRAVENOUS
  Administered 2015-07-26: 09:00:00 via INTRAVENOUS
  Administered 2015-07-26: 1000 mL via INTRAVENOUS

## 2015-07-26 MED ORDER — PROPOFOL 10 MG/ML IV BOLUS
INTRAVENOUS | Status: AC
  Filled 2015-07-26: qty 20

## 2015-07-26 MED ORDER — BUPIVACAINE LIPOSOME 1.3 % IJ SUSP
INTRAMUSCULAR | Status: DC | PRN
  Administered 2015-07-26: 20 mL

## 2015-07-26 SURGICAL SUPPLY — 46 items
BAG HAMPER (MISCELLANEOUS) ×3 IMPLANT
BLADE SURG SZ10 CARB STEEL (BLADE) ×3 IMPLANT
CLOTH BEACON ORANGE TIMEOUT ST (SAFETY) ×3 IMPLANT
COVER LIGHT HANDLE STERIS (MISCELLANEOUS) ×6 IMPLANT
DRAPE WARM FLUID 44X44 (DRAPE) ×3 IMPLANT
DRSG OPSITE POSTOP 4X10 (GAUZE/BANDAGES/DRESSINGS) ×1 IMPLANT
ELECT REM PT RETURN 9FT ADLT (ELECTROSURGICAL) ×3
ELECTRODE REM PT RTRN 9FT ADLT (ELECTROSURGICAL) ×2 IMPLANT
FORMALIN 10 PREFIL 480ML (MISCELLANEOUS) ×1 IMPLANT
GAUZE SPONGE 4X4 12PLY STRL (GAUZE/BANDAGES/DRESSINGS) ×1 IMPLANT
GLOVE BIO SURGEON STRL SZ 6.5 (GLOVE) ×1 IMPLANT
GLOVE BIOGEL PI IND STRL 7.0 (GLOVE) ×2 IMPLANT
GLOVE BIOGEL PI IND STRL 7.5 (GLOVE) IMPLANT
GLOVE BIOGEL PI IND STRL 8 (GLOVE) ×2 IMPLANT
GLOVE BIOGEL PI INDICATOR 7.0 (GLOVE) ×4
GLOVE BIOGEL PI INDICATOR 7.5 (GLOVE) ×1
GLOVE BIOGEL PI INDICATOR 8 (GLOVE) ×1
GLOVE ECLIPSE 8.0 STRL XLNG CF (GLOVE) ×3 IMPLANT
GOWN STRL REUS W/ TWL LRG LVL3 (GOWN DISPOSABLE) ×4 IMPLANT
GOWN STRL REUS W/TWL LRG LVL3 (GOWN DISPOSABLE) ×9
GOWN STRL REUS W/TWL XL LVL3 (GOWN DISPOSABLE) ×3 IMPLANT
HEMOSTAT ARISTA ABSORB 1G (Miscellaneous) ×1 IMPLANT
INST SET MAJOR GENERAL (KITS) ×3 IMPLANT
KIT ROOM TURNOVER APOR (KITS) ×3 IMPLANT
LIQUID BAND (GAUZE/BANDAGES/DRESSINGS) ×1 IMPLANT
MANIFOLD NEPTUNE II (INSTRUMENTS) ×3 IMPLANT
NDL HYPO 21X1.5 SAFETY (NEEDLE) ×2 IMPLANT
NEEDLE HYPO 21X1.5 SAFETY (NEEDLE) ×3 IMPLANT
NS IRRIG 1000ML POUR BTL (IV SOLUTION) ×6 IMPLANT
PACK ABDOMINAL MAJOR (CUSTOM PROCEDURE TRAY) ×3 IMPLANT
PAD ABD 5X9 TENDERSORB (GAUZE/BANDAGES/DRESSINGS) ×2 IMPLANT
PAD ARMBOARD 7.5X6 YLW CONV (MISCELLANEOUS) ×3 IMPLANT
RETRACTOR WND ALEXIS 25 LRG (MISCELLANEOUS) IMPLANT
RTRCTR WOUND ALEXIS 25CM LRG (MISCELLANEOUS) ×3
SET BASIN LINEN APH (SET/KITS/TRAYS/PACK) ×3 IMPLANT
SUT CHROMIC 0 CT 1 (SUTURE) ×3 IMPLANT
SUT MON AB 3-0 SH 27 (SUTURE) ×1 IMPLANT
SUT PLAIN 2 0 XLH (SUTURE) ×1 IMPLANT
SUT VIC AB 0 CT1 27 (SUTURE) ×12
SUT VIC AB 0 CT1 27XBRD ANTBC (SUTURE) ×2 IMPLANT
SUT VIC AB 0 CT1 27XCR 8 STRN (SUTURE) ×4 IMPLANT
SUT VIC AB 0 CTX 36 (SUTURE) ×3
SUT VIC AB 0 CTX36XBRD ANTBCTR (SUTURE) ×2 IMPLANT
SYR 20CC LL (SYRINGE) ×3 IMPLANT
TAPE CLOTH SURG 4X10 WHT LF (GAUZE/BANDAGES/DRESSINGS) ×1 IMPLANT
TRAY FOLEY CATH SILVER 16FR (SET/KITS/TRAYS/PACK) ×3 IMPLANT

## 2015-07-26 NOTE — Anesthesia Postprocedure Evaluation (Signed)
Anesthesia Post Note  Patient: Brittany Daniels  Procedure(s) Performed: Procedure(s) (LRB): HYSTERECTOMY SUPRACERVICAL ABDOMINAL (N/A) BILATERAL SALPINGO OOPHORECTOMY (Bilateral)  Patient location during evaluation: PACU Anesthesia Type: General Level of consciousness: awake, oriented and patient cooperative Pain management: pain level controlled Vital Signs Assessment: post-procedure vital signs reviewed and stable Respiratory status: spontaneous breathing, nonlabored ventilation and respiratory function stable Cardiovascular status: blood pressure returned to baseline Postop Assessment: no headache and no signs of nausea or vomiting Anesthetic complications: no    Last Vitals:  Filed Vitals:   07/26/15 1041 07/26/15 1045  BP:  125/67  Pulse: 61 59  Temp:    Resp: 15 17    Last Pain:  Filed Vitals:   07/26/15 1052  PainSc: Asleep                 Tip Atienza J

## 2015-07-26 NOTE — Transfer of Care (Signed)
Immediate Anesthesia Transfer of Care Note  Patient: Brittany Daniels  Procedure(s) Performed: Procedure(s): HYSTERECTOMY SUPRACERVICAL ABDOMINAL (N/A) BILATERAL SALPINGO OOPHORECTOMY (Bilateral)  Patient Location: PACU  Anesthesia Type:General  Level of Consciousness: awake  Airway & Oxygen Therapy: Patient Spontanous Breathing, Patient connected to nasal cannula oxygen and Patient connected to T-piece oxygen  Post-op Assessment: Report given to RN, Post -op Vital signs reviewed and stable and Patient moving all extremities  Post vital signs: Reviewed and stable  Last Vitals:  Filed Vitals:   07/26/15 0730 07/26/15 0733  BP: 144/85 140/82  Pulse:    Temp:    Resp: 23 22    Last Pain: There were no vitals filed for this visit.    Patients Stated Pain Goal: 7 ([id number])  Complications: No apparent anesthesia complications

## 2015-07-26 NOTE — Progress Notes (Signed)
Skin to right lower abd, under skin flap, red and without drainage.

## 2015-07-26 NOTE — Op Note (Signed)
Preoperative diagnosis:  1.  Rapidly enlarging single myoma                                          2.  Degenerating causing pelvic abdominal pain                                         3.  S/p ablation 2014, no hematometra                                         4.  Morbid obesity  Postoperative diagnosis:  Same as above + NED otherwise  Procedure:  Abdominal hysterectomy, supracervical with removal of both tubes and ovaries  Surgeon:  Florian Buff  Assistant:    Anesthesia:  General endotracheal  Preoperative clinical summary:  Brittany Daniels is a 50 y.o. No obstetric history on file. with No LMP recorded. Patient has had an ablation. admitted for a abdominal supracervical hysterectomy with removal of tubes and ovaries.  Pt has had dramatic increase in abdominal pain over thepast few weeks, went to ED and found a 12 cm single myoma, probably degenerating. Pt had an ablation, no hematometra, no evidence of other intra abdominal disease although pt made aware possibility of atypical myoma/leimyosarcoma is possible, unl;ikely but possible. Surgical indication for pain alone  Intraoperative findings: large leiomyoma, no evidence of any other intra peritoneal disease  Description of operation:  Patient was taken to the operating room and placed in the supine position where she underwent general endotracheal anesthesia.  She was then prepped and draped in the usual sterile fashion and a Foley catheter was placed for continuous bladder drainage.  A Pfannenstiel skin incision was made and carried down sharply to the rectus fascia which was scored in the midline and extended laterally.  The fascia was taken off the muscles superiorly and inferiorly without difficulty.  The muscles were divided.  The peritoneal cavity was entered.  An medium Alexis self-retaining retractor was placed.  The upper abdomen was packed away. Both uterine cornu were grasped with Coker clamps.  The left round ligament  was suture ligated and coagulated with the electrocautery unit.  The left vesicouterine serosal flap was created.  An avascular window in in the peritoneum was created and the left ovarian vasculature was cross clamped, cut and double suture ligated. Thus the left tube and ovary was removed.  The right round ligament was suture ligated and cut with the electrocautery unit.  The vesicouterine serosal flap on the right was created.  An avascular window in the peritoneum was created and the right ovarian vasculature  was cross clamped, cut and double suture ligated.  Thus the right ovary and tube was removed..  The uterine vessels were skeletonized bilaterally.  The uterine vessels were clamped bilaterally,  then cut and suture ligated.  Two more pedicles were taken down the cervix medial to the uterine vessels.  Each pedicle was clamped cut and suture ligated with good resulting hemostasis.  As per the preoperative plan the cervix was then transected sharply and the specimen was removed.  The cervical stump was then closed anterior to posterior for hemostasis and reduce postoperative adhesions.  The pelvis was irrigated vigorously and all pedicles were examined and found to be hemostatic.  .  All specimens were sent to pathology for routine evaluation.  The Alexis self-retaining retractor was removed and the pelvis was irrigated vigorously.  All packs were removed and all counts were correct at this point x 3.  The muscles and peritoneum were reapproximated loosely.  The fascia was closed with 0 Vicryl running.  The subcutaneous tissue was reapproximated using 2-0 plain gut.  The skin was closed using 3-0 Vicryl on a Keith needle in a subcuticular fashion.  Liquiban was then applied for additional wound integrity and to serve as a postoperative bacterial barrier.  The patient was awakened from anesthesia taken to the recovery room in good stable condition. All sponge instrument and needle counts were correct x 3.  The  patient received Ancef and Toradol prophylactically preoperatively.  Estimated blood loss for the procedure was 500  cc.  Florian Buff, MD 07/26/2015 9:54 AM

## 2015-07-26 NOTE — Anesthesia Preprocedure Evaluation (Signed)
Anesthesia Evaluation  Patient identified by MRN, date of birth, ID band Patient awake    Reviewed: Allergy & Precautions, H&P , NPO status , Patient's Chart, lab work & pertinent test results  History of Anesthesia Complications (+) PONV and history of anesthetic complications  Airway Mallampati: III  TM Distance: >3 FB Neck ROM: Full    Dental  (+) Teeth Intact   Pulmonary neg pulmonary ROS, former smoker,    breath sounds clear to auscultation       Cardiovascular hypertension, Pt. on medications  Rhythm:Regular Rate:Normal     Neuro/Psych  Headaches, PSYCHIATRIC DISORDERS Anxiety    GI/Hepatic GERD  ,  Endo/Other    Renal/GU      Musculoskeletal   Abdominal   Peds  Hematology   Anesthesia Other Findings   Reproductive/Obstetrics                             Anesthesia Physical Anesthesia Plan  ASA: II  Anesthesia Plan: General   Post-op Pain Management:    Induction: Intravenous, Rapid sequence and Cricoid pressure planned  Airway Management Planned: Oral ETT and Video Laryngoscope Planned  Additional Equipment:   Intra-op Plan:   Post-operative Plan: Extubation in OR  Informed Consent: I have reviewed the patients History and Physical, chart, labs and discussed the procedure including the risks, benefits and alternatives for the proposed anesthesia with the patient or authorized representative who has indicated his/her understanding and acceptance.     Plan Discussed with:   Anesthesia Plan Comments:         Anesthesia Quick Evaluation

## 2015-07-26 NOTE — H&P (Signed)
Preoperative History and Physical  Brittany Daniels is a 50 y.o. No obstetric history on file. with No LMP recorded. Patient has had an ablation. admitted for a abdominal supracervical hysterectomy with removal of tubes and ovaries.  Pt has had dramatic increase in abdominal pain over thepast few weeks, went to ED and found a 12 cm single myoma, probably degenerating.  Pt had an ablation, no hematometra, no evidence of other intra abdominal disease although pt made aware possibility of atypical myoma/leimyosarcoma is possible, unl;ikely but possible.  Surgical indication for pain alone  PMH:    Past Medical History  Diagnosis Date  . Hypertension   . Hyperlipidemia   . PONV (postoperative nausea and vomiting)   . Anxiety   . Migraine     last migraine was 2 yrs ago    PSH:     Past Surgical History  Procedure Laterality Date  . Sinus surgery with instatrak    . Tonsilectomy, adenoidectomy, bilateral myringotomy and tubes      pt says only had tonsils out, no adenoidectomy or myringotomy.  Serafina Royals abstraction    . Dilitation & currettage/hystroscopy with thermachoice ablation N/A 06/10/2012    Procedure: DILATATION & CURETTAGE/HYSTEROSCOPY WITH THERMACHOICE ABLATION (TOTAL THERAPY TIME=5min19sec) ;  Surgeon: Florian Buff, MD;  Location: [facility];  Service: Gynecology;  Laterality: N/A;  . Holmium laser application N/A 0000000    Procedure: HOLMIUM LASER REMOVAL OF SUBMUCOSAL MYOMA;  Surgeon: Florian Buff, MD;  Location: [facility];  Service: Gynecology;  Laterality: N/A;  . Tonsillectomy      POb/GynH:      OB History    No data available      SH:   Social History  Substance Use Topics  . Smoking status: Former Smoker -- 0.25 packs/day for 1 years    Types: Cigarettes    Quit date: 07/20/1984  . Smokeless tobacco: Not on file  . Alcohol Use: No    FH:    Family History  Problem Relation Age of Onset  . Hypertension Mother   . Diabetes Father   . Cancer Maternal  Grandmother     breast cancer  . Cancer Maternal Grandfather     skin cancer     Allergies:  Allergies  Allergen Reactions  . Ace Inhibitors Cough  . Oxycodone Hcl Itching    Medications:       Current facility-administered medications:  .  bupivacaine liposome (EXPAREL) 1.3 % injection 266 mg, 20 mL, Infiltration, Once, Florian Buff, MD .  ceFAZolin (ANCEF) IVPB 2g/100 mL premix, 2 g, Intravenous, On Call to OR, Florian Buff, MD .  lactated ringers infusion, , Intravenous, Continuous, Lerry Liner, MD, Last Rate: 75 mL/hr at 07/26/15 0720, 1,000 mL at 07/26/15 0720 .  midazolam (VERSED) injection 1-2 mg, 1-2 mg, Intravenous, Q5 min PRN, Lerry Liner, MD, 2 mg at 07/26/15 0719 .  scopolamine (TRANSDERM-SCOP) 1 MG/3DAYS 1.5 mg, 1 patch, Transdermal, Once, Lerry Liner, MD, 1.5 mg at 07/26/15 0719  Review of Systems:   Review of Systems  Constitutional: Negative for fever, chills, weight loss, malaise/fatigue and diaphoresis.  HENT: Negative for hearing loss, ear pain, nosebleeds, congestion, sore throat, neck pain, tinnitus and ear discharge.   Eyes: Negative for blurred vision, double vision, photophobia, pain, discharge and redness.  Respiratory: Negative for cough, hemoptysis, sputum production, shortness of breath, wheezing and stridor.   Cardiovascular: Negative for chest pain, palpitations, orthopnea, claudication, leg swelling and PND.  Gastrointestinal: Positive for abdominal pain. Negative for heartburn, nausea, vomiting, diarrhea, constipation, blood in stool and melena.  Genitourinary: Negative for dysuria, urgency, frequency, hematuria and flank pain.  Musculoskeletal: Negative for myalgias, back pain, joint pain and falls.  Skin: Negative for itching and rash.  Neurological: Negative for dizziness, tingling, tremors, sensory change, speech change, focal weakness, seizures, loss of consciousness, weakness and headaches.  Endo/Heme/Allergies: Negative for  environmental allergies and polydipsia. Does not bruise/bleed easily.  Psychiatric/Behavioral: Negative for depression, suicidal ideas, hallucinations, memory loss and substance abuse. The patient is not nervous/anxious and does not have insomnia.      PHYSICAL EXAM:  Blood pressure 160/83, pulse 67, temperature 98 F (36.7 C), resp. rate 33, SpO2 95 %.    Vitals reviewed. Constitutional: She is oriented to person, place, and time. She appears well-developed and well-nourished.  HENT:  Head: Normocephalic and atraumatic.  Right Ear: External ear normal.  Left Ear: External ear normal.  Nose: Nose normal.  Mouth/Throat: Oropharynx is clear and moist.  Eyes: Conjunctivae and EOM are normal. Pupils are equal, round, and reactive to light. Right eye exhibits no discharge. Left eye exhibits no discharge. No scleral icterus.  Neck: Normal range of motion. Neck supple. No tracheal deviation present. No thyromegaly present.  Cardiovascular: Normal rate, regular rhythm, normal heart sounds and intact distal pulses.  Exam reveals no gallop and no friction rub.   No murmur heard. Respiratory: Effort normal and breath sounds normal. No respiratory distress. She has no wheezes. She has no rales. She exhibits no tenderness.  GI: Soft. Bowel sounds are normal. She exhibits no distension and no mass. There is tenderness. There is no rebound and no guarding.  Genitourinary:       Vulva is normal without lesions Vagina is pink moist without discharge Cervix normal in appearance and pap is normal Uterus is enlarged, 14 weeks size Adnexa is negative with normal sized ovaries by sonogram  Musculoskeletal: Normal range of motion. She exhibits no edema and no tenderness.  Neurological: She is alert and oriented to person, place, and time. She has normal reflexes. She displays normal reflexes. No cranial nerve deficit. She exhibits normal muscle tone. Coordination normal.  Skin: Skin is warm and dry. No  rash noted. No erythema. No pallor.  Psychiatric: She has a normal mood and affect. Her behavior is normal. Judgment and thought content normal.    Labs: Results for orders placed or performed during the hospital encounter of 07/21/15 (from the past 336 hour(s))  Urinalysis, Routine w reflex microscopic (not at [facility])   Collection Time: 07/21/15 11:18 AM  Result Value Ref Range   Color, Urine YELLOW YELLOW   APPearance CLEAR CLEAR   Specific Gravity, Urine 1.015 1.005 - 1.030   pH 7.5 5.0 - 8.0   Glucose, UA NEGATIVE NEGATIVE mg/dL   Hgb urine dipstick NEGATIVE NEGATIVE   Bilirubin Urine NEGATIVE NEGATIVE   Ketones, ur NEGATIVE NEGATIVE mg/dL   Protein, ur NEGATIVE NEGATIVE mg/dL   Nitrite NEGATIVE NEGATIVE   Leukocytes, UA NEGATIVE NEGATIVE  CBC   Collection Time: 07/21/15 11:19 AM  Result Value Ref Range   WBC 6.5 4.0 - 10.5 K/uL   RBC 4.48 3.87 - 5.11 MIL/uL   Hemoglobin 14.2 12.0 - 15.0 g/dL   HCT 41.6 36.0 - 46.0 %   MCV 92.9 78.0 - 100.0 fL   MCH 31.7 26.0 - 34.0 pg   MCHC 34.1 30.0 - 36.0 g/dL   RDW 13.1 11.5 -  15.5 %   Platelets 252 150 - 400 K/uL  Comprehensive metabolic panel   Collection Time: 07/21/15 11:19 AM  Result Value Ref Range   Sodium 137 135 - 145 mmol/L   Potassium 4.0 3.5 - 5.1 mmol/L   Chloride 105 101 - 111 mmol/L   CO2 26 22 - 32 mmol/L   Glucose, Bld 100 (H) 65 - 99 mg/dL   BUN 14 6 - 20 mg/dL   Creatinine, Ser 1.00 0.44 - 1.00 mg/dL   Calcium 9.2 8.9 - 10.3 mg/dL   Total Protein 6.5 6.5 - 8.1 g/dL   Albumin 3.7 3.5 - 5.0 g/dL   AST 20 15 - 41 U/L   ALT 20 14 - 54 U/L   Alkaline Phosphatase 41 38 - 126 U/L   Total Bilirubin 0.8 0.3 - 1.2 mg/dL   GFR calc non Af Amer >60 >60 mL/min   GFR calc Af Amer >60 >60 mL/min   Anion gap 6 5 - 15  hCG, quantitative, pregnancy   Collection Time: 07/21/15 11:19 AM  Result Value Ref Range   hCG, Beta Chain, Quant, S 1 <5 mIU/mL  Type and screen   Collection Time: 07/21/15 11:19 AM  Result Value Ref  Range   ABO/RH(D) A POS    Antibody Screen NEG    Sample Expiration 08/04/2015    Extend sample reason NO TRANSFUSIONS OR PREGNANCY IN THE PAST 3 MONTHS     EKG: Orders placed or performed during the hospital encounter of 07/21/15  . EKG 12-Lead  . EKG 12-Lead    Imaging Studies: Ct Abdomen Pelvis W Contrast  07/11/2015  CLINICAL DATA:  Acute onset of right lower quadrant and mid abdominal pain. Severe nausea and vomiting. Initial encounter. EXAM: CT ABDOMEN AND PELVIS WITH CONTRAST TECHNIQUE: Multidetector CT imaging of the abdomen and pelvis was performed using the standard protocol following bolus administration of intravenous contrast. CONTRAST:  1104mL ISOVUE-300 IOPAMIDOL (ISOVUE-300) INJECTION 61% COMPARISON:  CT of the abdomen and pelvis from 11/18/2009 FINDINGS: The visualized lung bases are clear. The liver and spleen are unremarkable in appearance. The gallbladder is decompressed and grossly unremarkable. The pancreas and adrenal glands are unremarkable. The kidneys are unremarkable in appearance. There is no evidence of hydronephrosis. No renal or ureteral stones are seen. No perinephric stranding is appreciated. No free fluid is identified. The small bowel is unremarkable in appearance. The stomach is within normal limits. No acute vascular abnormalities are seen. Minimal calcification is seen along the abdominal aorta. The appendix is normal in caliber, without evidence of appendicitis. Contrast progresses to the level of the rectum. The colon is unremarkable in appearance. The bladder is mildly distended and grossly unremarkable. A large uterine fibroid is seen, measuring approximately 12.1 cm. The ovaries are grossly symmetric. No suspicious adnexal masses are seen. No inguinal lymphadenopathy is seen. No acute osseous abnormalities are identified. IMPRESSION: 1. No acute abnormality seen within the abdomen or pelvis. 2. Large uterine fibroid, measuring 12.1 cm, new from 2011. If  undergoing mild degeneration, this could correspond to the patient's symptoms. Electronically Signed   By: Garald Balding M.D.   On: 07/11/2015 20:39      Assessment: Rapidly enlarging single myoma, probably degenerating, and causing abdominal pelvic pain  Patient Active Problem List   Diagnosis Date Noted  . Hyperlipidemia 04/30/2012  . Anxiety 04/30/2012    Plan: Supracervical hysterectomy with removal of both tubes and ovaries  Pt understands the risks of surgery including but  not limited t  excessive bleeding requiring transfusion or reoperation, post-operative infection requiring prolonged hospitalization or re-hospitalization and antibiotic therapy, and damage to other organs including bladder, bowel, ureters and major vessels.  The patient also understands the alternative treatment options which were discussed in full.  All questions were answered.  Dottie Vaquerano H 07/26/2015 7:29 AM   Mari Battaglia H 07/26/2015 7:23 AM

## 2015-07-26 NOTE — Anesthesia Procedure Notes (Signed)
Procedure Name: Intubation Date/Time: 07/26/2015 7:46 AM Performed by: Charmaine Downs Pre-anesthesia Checklist: Patient identified, Emergency Drugs available, Suction available and Patient being monitored Patient Re-evaluated:Patient Re-evaluated prior to inductionOxygen Delivery Method: Circle system utilized Preoxygenation: Pre-oxygenation with 100% oxygen Intubation Type: IV induction, Rapid sequence and Cricoid Pressure applied Ventilation: Mask ventilation without difficulty Grade View: Grade III Number of attempts: 1 Airway Equipment and Method: Stylet and Video-laryngoscopy Placement Confirmation: ETT inserted through vocal cords under direct vision,  positive ETCO2 and breath sounds checked- equal and bilateral Secured at: 22 cm Tube secured with: Tape Dental Injury: Teeth and Oropharynx as per pre-operative assessment  Difficulty Due To: Difficulty was anticipated, Difficult Airway- due to anterior larynx and Difficult Airway- due to limited oral opening Future Recommendations: Recommend- induction with short-acting agent, and alternative techniques readily available

## 2015-07-27 DIAGNOSIS — D259 Leiomyoma of uterus, unspecified: Secondary | ICD-10-CM | POA: Diagnosis not present

## 2015-07-27 LAB — BASIC METABOLIC PANEL
Anion gap: 6 (ref 5–15)
BUN: 9 mg/dL (ref 6–20)
CHLORIDE: 106 mmol/L (ref 101–111)
CO2: 26 mmol/L (ref 22–32)
CREATININE: 0.91 mg/dL (ref 0.44–1.00)
Calcium: 8 mg/dL — ABNORMAL LOW (ref 8.9–10.3)
GFR calc Af Amer: >60 mL/min (ref >60)
GFR calc non Af Amer: >60 mL/min (ref >60)
GLUCOSE: 131 mg/dL — AB (ref 65–99)
POTASSIUM: 3.8 mmol/L (ref 3.5–5.1)
Sodium: 138 mmol/L (ref 135–145)

## 2015-07-27 LAB — CBC
HEMATOCRIT: 34.6 % — AB (ref 36.0–46.0)
HEMOGLOBIN: 11.4 g/dL — AB (ref 12.0–15.0)
MCH: 30.9 pg (ref 26.0–34.0)
MCHC: 32.9 g/dL (ref 30.0–36.0)
MCV: 93.8 fL (ref 78.0–100.0)
Platelets: 230 10*3/uL (ref 150–400)
RBC: 3.69 MIL/uL — AB (ref 3.87–5.11)
RDW: 13.1 % (ref 11.5–15.5)
WBC: 10 10*3/uL (ref 4.0–10.5)

## 2015-07-27 MED ORDER — DIPHENHYDRAMINE HCL 25 MG PO CAPS
50.0000 mg | ORAL_CAPSULE | Freq: Once | ORAL | Status: AC
  Administered 2015-07-27: 50 mg via ORAL
  Filled 2015-07-27: qty 2

## 2015-07-27 MED ORDER — HYDROMORPHONE HCL 2 MG PO TABS: 2.0000 mg | ORAL_TABLET | ORAL | Status: DC | PRN

## 2015-07-27 MED ORDER — ONDANSETRON HCL 8 MG PO TABS: 8.0000 mg | ORAL_TABLET | Freq: Four times a day (QID) | ORAL | Status: DC | PRN

## 2015-07-27 NOTE — Anesthesia Postprocedure Evaluation (Signed)
Anesthesia Post Note  Patient: Brittany Daniels  Procedure(s) Performed: Procedure(s) (LRB): HYSTERECTOMY SUPRACERVICAL ABDOMINAL (N/A) BILATERAL SALPINGO OOPHORECTOMY (Bilateral)  Patient location during evaluation: Nursing Unit Anesthesia Type: General Level of consciousness: oriented and awake and alert Pain management: pain level controlled Vital Signs Assessment: post-procedure vital signs reviewed and stable Respiratory status: spontaneous breathing Cardiovascular status: stable Postop Assessment: no signs of nausea or vomiting Anesthetic complications: no    Last Vitals:  Filed Vitals:   07/27/15 0147 07/27/15 0500  BP: 125/68 134/65  Pulse: 65 59  Temp: 36.7 C 36.7 C  Resp: 18 20    Last Pain:  Filed Vitals:   07/27/15 0812  PainSc: 0-No pain                 Faaris Arizpe A

## 2015-07-27 NOTE — Progress Notes (Addendum)
Discharged instructions and prescriptions given, verbalized understanding, out in stable condition via w/c with staff.

## 2015-07-27 NOTE — Discharge Summary (Signed)
Physician Discharge Summary  Patient ID: Brittany Daniels MRN: [id number] DOB/AGE: [birth year] 50 y.o.  Admit date: 07/26/2015 Discharge date: 07/27/2015  Admission Diagnoses: Rapidly enlarging solitary myoma causing pelvic abdominal pain Discharge Diagnoses:  Active Problems:   S/P abdominal supracervical subtotal hysterectomy with removal of both tubes and ovaries  Discharged Condition: good  Hospital Course: unremarkable post operative course  Consults: None  Significant Diagnostic Studies: labs:   Treatments: surgery: abdominal su[pracerivcal hysterectomy with removal of both tubes and ovaries  Discharge Exam: Blood pressure 134/65, pulse 59, temperature 98.1 F (36.7 C), temperature source Oral, resp. rate 20, height 5\' 4"  (1.626 m), weight 267 lb 3.2 oz (121.2 kg), SpO2 100 %. General appearance: alert, cooperative and no distress GI: soft, non-tender; bowel sounds normal; no masses,  no organomegaly Incision/Wound:clean dry intact, no s/s/ infection  Disposition: 01-Home or Self Care  Discharge Instructions    Call MD for:  persistant nausea and vomiting    Complete by:  As directed      Call MD for:  severe uncontrolled pain    Complete by:  As directed      Call MD for:  temperature >100.4    Complete by:  As directed      Diet - low sodium heart healthy    Complete by:  As directed      Driving Restrictions    Complete by:  As directed   None for a week     Increase activity slowly    Complete by:  As directed      Leave dressing on - Keep it clean, dry, and intact until clinic visit    Complete by:  As directed      Lifting restrictions    Complete by:  As directed   No more than 10 pounds     Sexual Activity Restrictions    Complete by:  As directed   No sex            Medication List    STOP taking these medications        terconazole 0.4 % vaginal cream  Commonly known as:  TERAZOL 7      TAKE these medications        acetaminophen 650 MG  CR tablet  Commonly known as:  TYLENOL  Take 1,300 mg by mouth every 8 (eight) hours as needed (headache).     ALPRAZolam 0.5 MG tablet  Commonly known as:  XANAX  Take 1 tablet by mouth 3 (three) times daily as needed.     BC HEADACHE POWDER PO  Take 1 Package by mouth daily as needed (headache).     cholecalciferol 1000 units tablet  Commonly known as:  VITAMIN D  Take 1,000 Units by mouth daily.     escitalopram 10 MG tablet  Commonly known as:  LEXAPRO  Take 1 tablet by mouth daily.     fish oil-omega-3 fatty acids 1000 MG capsule  Take 2 g by mouth daily. Reported on 07/18/2015     hydrochlorothiazide 25 MG tablet  Commonly known as:  HYDRODIURIL  Take 1 tablet by mouth daily.     HYDROmorphone 2 MG tablet  Commonly known as:  DILAUDID  Take 1-2 tablets (2-4 mg total) by mouth every 4 (four) hours as needed for moderate pain or severe pain.     ibuprofen 800 MG tablet  Commonly known as:  ADVIL,MOTRIN  Take 800 mg by mouth every 8 (eight) hours as  needed for headache.     losartan 100 MG tablet  Commonly known as:  COZAAR  Take 1 tablet by mouth daily.     ondansetron 8 MG tablet  Commonly known as:  ZOFRAN  Take 1 tablet (8 mg total) by mouth every 6 (six) hours as needed for nausea.           Follow-up Information    Follow up with Florian Buff, MD In 1 week.   Specialties:  Obstetrics and Gynecology, Radiology   Why:  post op visit, should have it scheduled already   Contact information:   [place] [id number] [phone]       Signed: Florian Buff 07/27/2015, 9:08 AM

## 2015-07-27 NOTE — Addendum Note (Signed)
Addendum  created 07/27/15 [id number] by Mickel Baas, CRNA   Modules edited: Clinical Notes   Clinical Notes:  File: [id number]

## 2015-07-27 NOTE — Progress Notes (Signed)
Patient with complaints of itching, patient face is very flushed and cheeks red, MD Eure notified and ordered given for benadryl 50 mg orally once, order entered Neta Mends RN 8:33 AM 07-27-2015

## 2015-07-27 NOTE — Progress Notes (Signed)
Foley catheter removed with no complications. Instructed pt to notify nurse when she voids. Also instructed pt to notify nurse if she does not void, but feels the urge and pressure so that we can help remedy the issue. Pt vocalized understanding.

## 2015-07-31 ENCOUNTER — Emergency Department (HOSPITAL_COMMUNITY)
Admission: EM | Admit: 2015-07-31 | Discharge: 2015-07-31 | Disposition: A | Discharge status: Home / Self Care | Payer: 59 | Attending: Emergency Medicine | Admitting: Emergency Medicine

## 2015-07-31 ENCOUNTER — Telehealth: Payer: Self-pay | Admitting: *Deleted

## 2015-07-31 ENCOUNTER — Emergency Department (HOSPITAL_COMMUNITY): Payer: 59

## 2015-07-31 ENCOUNTER — Encounter (HOSPITAL_COMMUNITY): Payer: Self-pay | Admitting: Emergency Medicine

## 2015-07-31 DIAGNOSIS — Z87891 Personal history of nicotine dependence: Secondary | ICD-10-CM | POA: Diagnosis not present

## 2015-07-31 DIAGNOSIS — Z79899 Other long term (current) drug therapy: Secondary | ICD-10-CM | POA: Insufficient documentation

## 2015-07-31 DIAGNOSIS — Z79891 Long term (current) use of opiate analgesic: Secondary | ICD-10-CM | POA: Diagnosis not present

## 2015-07-31 DIAGNOSIS — M79606 Pain in leg, unspecified: Secondary | ICD-10-CM

## 2015-07-31 DIAGNOSIS — I10 Essential (primary) hypertension: Secondary | ICD-10-CM | POA: Diagnosis not present

## 2015-07-31 DIAGNOSIS — M79604 Pain in right leg: Secondary | ICD-10-CM | POA: Insufficient documentation

## 2015-07-31 DIAGNOSIS — Z791 Long term (current) use of non-steroidal anti-inflammatories (NSAID): Secondary | ICD-10-CM | POA: Diagnosis not present

## 2015-07-31 DIAGNOSIS — E785 Hyperlipidemia, unspecified: Secondary | ICD-10-CM | POA: Diagnosis not present

## 2015-07-31 DIAGNOSIS — M79605 Pain in left leg: Secondary | ICD-10-CM | POA: Insufficient documentation

## 2015-07-31 LAB — CBC WITH DIFFERENTIAL/PLATELET
Basophils Absolute: 0 10*3/uL (ref 0.0–0.1)
Basophils Relative: 0 %
EOS ABS: 0.2 10*3/uL (ref 0.0–0.7)
EOS PCT: 3 %
HCT: 37.9 % (ref 36.0–46.0)
Hemoglobin: 13.3 g/dL (ref 12.0–15.0)
LYMPHS ABS: 1.5 10*3/uL (ref 0.7–4.0)
Lymphocytes Relative: 19 %
MCH: 31.9 pg (ref 26.0–34.0)
MCHC: 35.1 g/dL (ref 30.0–36.0)
MCV: 90.9 fL (ref 78.0–100.0)
Monocytes Absolute: 0.6 10*3/uL (ref 0.1–1.0)
Monocytes Relative: 8 %
Neutro Abs: 5.8 10*3/uL (ref 1.7–7.7)
Neutrophils Relative %: 70 %
PLATELETS: 284 10*3/uL (ref 150–400)
RBC: 4.17 MIL/uL (ref 3.87–5.11)
RDW: 12.6 % (ref 11.5–15.5)
WBC: 8.2 10*3/uL (ref 4.0–10.5)

## 2015-07-31 LAB — COMPREHENSIVE METABOLIC PANEL
ALT: 23 U/L (ref 14–54)
ANION GAP: 8 (ref 5–15)
AST: 23 U/L (ref 15–41)
Albumin: 3.6 g/dL (ref 3.5–5.0)
Alkaline Phosphatase: 54 U/L (ref 38–126)
BUN: 11 mg/dL (ref 6–20)
CHLORIDE: 102 mmol/L (ref 101–111)
CO2: 26 mmol/L (ref 22–32)
CREATININE: 0.87 mg/dL (ref 0.44–1.00)
Calcium: 9.3 mg/dL (ref 8.9–10.3)
GFR calc non Af Amer: >60 mL/min (ref >60)
Glucose, Bld: 97 mg/dL (ref 65–99)
POTASSIUM: 3.5 mmol/L (ref 3.5–5.1)
SODIUM: 136 mmol/L (ref 135–145)
Total Bilirubin: 1.3 mg/dL — ABNORMAL HIGH (ref 0.3–1.2)
Total Protein: 7.2 g/dL (ref 6.5–8.1)

## 2015-07-31 MED ORDER — KETOROLAC TROMETHAMINE 30 MG/ML IJ SOLN
30.0000 mg | Freq: Once | INTRAMUSCULAR | Status: AC
  Administered 2015-07-31: 30 mg via INTRAVENOUS
  Filled 2015-07-31: qty 1

## 2015-07-31 MED ORDER — CYCLOBENZAPRINE HCL 10 MG PO TABS: ORAL_TABLET | ORAL | Status: AC

## 2015-07-31 MED ORDER — LORAZEPAM 2 MG/ML IJ SOLN
1.0000 mg | Freq: Once | INTRAMUSCULAR | Status: AC
  Administered 2015-07-31: 1 mg via INTRAVENOUS
  Filled 2015-07-31: qty 1

## 2015-07-31 NOTE — ED Notes (Signed)
Patient walked to the bathroom with minimal assistance.  

## 2015-07-31 NOTE — ED Provider Notes (Signed)
CSN: [id number]     Arrival date & time 07/31/15  1444 History   First MD Initiated Contact with Patient 07/31/15 1645     Chief Complaint  Patient presents with  . Leg Pain     (Consider location/radiation/quality/duration/timing/severity/associated sxs/prior Treatment) Patient is a 50 y.o. female presenting with leg pain. The history is provided by the patient (Patient complains of pain in both lower extremities in her calves).  Leg Pain Lower extremity pain location: Bilateral calves. Pain details:    Quality:  Aching   Radiates to:  Does not radiate   Severity:  Moderate   Onset quality:  Sudden   Timing:  Constant   Progression:  Waxing and waning Chronicity:  New Associated symptoms: no back pain and no fatigue     Past Medical History  Diagnosis Date  . Hypertension   . Hyperlipidemia   . PONV (postoperative nausea and vomiting)   . Anxiety   . Migraine     last migraine was 2 yrs ago   Past Surgical History  Procedure Laterality Date  . Sinus surgery with instatrak    . Tonsilectomy, adenoidectomy, bilateral myringotomy and tubes      pt says only had tonsils out, no adenoidectomy or myringotomy.  Serafina Royals abstraction    . Dilitation & currettage/hystroscopy with thermachoice ablation N/A 06/10/2012    Procedure: DILATATION & CURETTAGE/HYSTEROSCOPY WITH THERMACHOICE ABLATION (TOTAL THERAPY TIME=63min19sec) ;  Surgeon: Florian Buff, MD;  Location: [facility];  Service: Gynecology;  Laterality: N/A;  . Holmium laser application N/A 0000000    Procedure: HOLMIUM LASER REMOVAL OF SUBMUCOSAL MYOMA;  Surgeon: Florian Buff, MD;  Location: [facility];  Service: Gynecology;  Laterality: N/A;  . Tonsillectomy    . Abdominal hysterectomy     Family History  Problem Relation Age of Onset  . Hypertension Mother   . Diabetes Father   . Cancer Maternal Grandmother     breast cancer  . Cancer Maternal Grandfather     skin cancer   Social History  Substance Use Topics  .  Smoking status: Former Smoker -- 0.25 packs/day for 1 years    Types: Cigarettes    Quit date: 07/20/1984  . Smokeless tobacco: None  . Alcohol Use: No   OB History    No data available     Review of Systems  Constitutional: Negative for appetite change and fatigue.  HENT: Negative for congestion, ear discharge and sinus pressure.   Eyes: Negative for discharge.  Respiratory: Negative for cough.   Cardiovascular: Negative for chest pain.  Gastrointestinal: Negative for abdominal pain and diarrhea.  Genitourinary: Negative for frequency and hematuria.  Musculoskeletal: Negative for back pain.       Bilateral lower leg pain  Skin: Negative for rash.  Neurological: Negative for seizures and headaches.  Psychiatric/Behavioral: Negative for hallucinations.      Allergies  Ace inhibitors and Oxycodone hcl  Home Medications   Prior to Admission medications   Medication Sig Start Date End Date Taking? Authorizing Provider  acetaminophen (TYLENOL) 650 MG CR tablet Take 1,300 mg by mouth every 8 (eight) hours as needed (headache).   Yes Historical Provider, MD  ALPRAZolam Duanne Moron) 0.5 MG tablet Take 1 tablet by mouth 3 (three) times daily as needed for anxiety.  05/09/15  Yes Historical Provider, MD  cholecalciferol (VITAMIN D) 1000 UNITS tablet Take 1,000 Units by mouth daily.   Yes Historical Provider, MD  escitalopram (LEXAPRO) 10 MG tablet Take  1 tablet by mouth daily. 05/08/15  Yes Historical Provider, MD  fish oil-omega-3 fatty acids 1000 MG capsule Take 1 g by mouth daily. Reported on 07/18/2015   Yes Historical Provider, MD  hydrochlorothiazide (HYDRODIURIL) 25 MG tablet Take 1 tablet by mouth daily. 06/05/15  Yes Historical Provider, MD  HYDROmorphone (DILAUDID) 2 MG tablet Take 1-2 tablets (2-4 mg total) by mouth every 4 (four) hours as needed for moderate pain or severe pain. 07/27/15  Yes Florian Buff, MD  ibuprofen (ADVIL,MOTRIN) 800 MG tablet Take 800 mg by mouth every 8  (eight) hours as needed for headache.   Yes Historical Provider, MD  losartan (COZAAR) 100 MG tablet Take 1 tablet by mouth daily. 06/05/15  Yes Historical Provider, MD  ondansetron (ZOFRAN) 8 MG tablet Take 1 tablet (8 mg total) by mouth every 6 (six) hours as needed for nausea. 07/27/15  Yes Florian Buff, MD  cyclobenzaprine (FLEXERIL) 10 MG tablet Take one every 8 hours for muscle cramps 07/31/15   Milton Ferguson, MD   BP 116/71 mmHg  Pulse 63  Temp(Src) 98.4 F (36.9 C) (Oral)  Resp 20  Ht 5\' 4"  (1.626 m)  Wt 267 lb (121.11 kg)  BMI 45.81 kg/m2  SpO2 96%  LMP 04/21/2012 Physical Exam  Constitutional: She is oriented to person, place, and time. She appears well-developed.  HENT:  Head: Normocephalic.  Eyes: Conjunctivae and EOM are normal. No scleral icterus.  Neck: Neck supple. No thyromegaly present.  Cardiovascular: Normal rate and regular rhythm.  Exam reveals no gallop and no friction rub.   No murmur heard. Pulmonary/Chest: No stridor. She has no wheezes. She has no rales. She exhibits no tenderness.  Abdominal: She exhibits no distension. There is no tenderness. There is no rebound.  Musculoskeletal: Normal range of motion. She exhibits no edema.  Moderate tenderness to both legs from the knee distally neurovascular exam normal  Lymphadenopathy:    She has no cervical adenopathy.  Neurological: She is oriented to person, place, and time. She exhibits normal muscle tone. Coordination normal.  Skin: No rash noted. No erythema.  Psychiatric: She has a normal mood and affect. Her behavior is normal.    ED Course  Procedures (including critical care time) Labs Review Labs Reviewed  COMPREHENSIVE METABOLIC PANEL - Abnormal; Notable for the following:    Total Bilirubin 1.3 (*)    All other components within normal limits  CBC WITH DIFFERENTIAL/PLATELET    Imaging Review US Venous Img Lower Bilateral  07/31/2015  CLINICAL DATA:  50 year old female with bilateral calf  pain, right greater left. EXAM: BILATERAL LOWER EXTREMITY VENOUS DOPPLER ULTRASOUND TECHNIQUE: Gray-scale sonography with graded compression, as well as color Doppler and duplex ultrasound were performed to evaluate the lower extremity deep venous systems from the level of the common femoral vein and including the common femoral, femoral, profunda femoral, popliteal and calf veins including the posterior tibial, peroneal and gastrocnemius veins when visible. The superficial great saphenous vein was also interrogated. Spectral Doppler was utilized to evaluate flow at rest and with distal augmentation maneuvers in the common femoral, femoral and popliteal veins. COMPARISON:  None. FINDINGS: RIGHT LOWER EXTREMITY Common Femoral Vein: No evidence of thrombus. Normal compressibility, respiratory phasicity and response to augmentation. Saphenofemoral Junction: No evidence of thrombus. Normal compressibility and flow on color Doppler imaging. Profunda Femoral Vein: No evidence of thrombus. Normal compressibility and flow on color Doppler imaging. Femoral Vein: No evidence of thrombus. Normal compressibility, respiratory phasicity and response  to augmentation. Popliteal Vein: No evidence of thrombus. Normal compressibility, respiratory phasicity and response to augmentation. Calf Veins: The posterior tibial vein is patent. The peroneal vein is poorly visualized. Superficial Great Saphenous Vein: No evidence of thrombus. Normal compressibility and flow on color Doppler imaging. Venous Reflux:  None. Other Findings:  None. LEFT LOWER EXTREMITY Common Femoral Vein: No evidence of thrombus. Normal compressibility, respiratory phasicity and response to augmentation. Saphenofemoral Junction: No evidence of thrombus. Normal compressibility and flow on color Doppler imaging. Profunda Femoral Vein: No evidence of thrombus. Normal compressibility and flow on color Doppler imaging. Femoral Vein: No evidence of thrombus. Normal  compressibility, respiratory phasicity and response to augmentation. Popliteal Vein: No evidence of thrombus. Normal compressibility, respiratory phasicity and response to augmentation. Calf Veins: The posterior tibial vein is patent. The peroneal vein is not well visualized. Superficial Great Saphenous Vein: No evidence of thrombus. Normal compressibility and flow on color Doppler imaging. Venous Reflux:  None. Other Findings: There is a 1.8 x 0.9 x 0.9 cm hyperechoic structure with no internal vascularity in the left popliteal fossa, likely a lipoma. CT or MRI may provide better evaluation. IMPRESSION: No evidence of deep venous thrombosis. Left popliteal fossa hyperechoic lesion, possibly a lipoma. CT or MRI may provide better evaluation. Electronically Signed   By: Anner Crete M.D.   On: 07/31/2015 20:12   I have personally reviewed and evaluated these images and lab results as part of my medical decision-making.   EKG Interpretation None      MDM   Final diagnoses:  Leg pain, diffuse, unspecified laterality    Ultrasound of legs and labs were all normal. Patient improved with treatment in emergency department. Suspect muscle spasms. Patient given Flexeril and follow-up with her doctor    Milton Ferguson, MD 07/31/15 2111

## 2015-07-31 NOTE — Telephone Encounter (Signed)
Pt states had a hysterectomy on 07/26/2015, c/o cramping in legs "muscle pain back of legs." Pt states no red area with heat. Informed pt to go to ER to be evaluated for Blood clots due to recent surgery. Pt verbalized understanding.

## 2015-07-31 NOTE — Discharge Instructions (Signed)
Follow up with your md °

## 2015-07-31 NOTE — ED Notes (Signed)
Pt states she had an abdominal hysterectomy and was d/c on 7/6.  States she has not been getting out of bed and both of her legs are aching with the right being worse than the left.

## 2015-08-01 ENCOUNTER — Encounter: Payer: Self-pay | Admitting: Obstetrics & Gynecology

## 2015-08-01 ENCOUNTER — Ambulatory Visit (INDEPENDENT_AMBULATORY_CARE_PROVIDER_SITE_OTHER): Payer: 59 | Admitting: Obstetrics & Gynecology

## 2015-08-01 VITALS — BP 120/80 | HR 76 | Wt 250.0 lb

## 2015-08-01 DIAGNOSIS — Z9889 Other specified postprocedural states: Secondary | ICD-10-CM

## 2015-08-01 NOTE — Progress Notes (Signed)
Patient ID: Brittany Daniels, female   DOB: September 16, 1965, 50 y.o.   MRN: [id number]  HPI: Patient returns for routine postoperative follow-up having undergone supracervical hysterectomy with removal of both tubes and ovaries on 07/26/2015.  The patient's immediate postoperative recovery has been unremarkable. Since hospital discharge the patient reports had some leg cramps yesterday but evaluation was negative.   Current Outpatient Prescriptions: ALPRAZolam (XANAX) 0.5 MG tablet, Take 1 tablet by mouth 3 (three) times daily as needed for anxiety. , Disp: , Rfl:  cholecalciferol (VITAMIN D) 1000 UNITS tablet, Take 1,000 Units by mouth daily., Disp: , Rfl:  escitalopram (LEXAPRO) 10 MG tablet, Take 1 tablet by mouth daily., Disp: , Rfl:  fish oil-omega-3 fatty acids 1000 MG capsule, Take 1 g by mouth daily. Reported on 07/18/2015, Disp: , Rfl:  hydrochlorothiazide (HYDRODIURIL) 25 MG tablet, Take 1 tablet by mouth daily., Disp: , Rfl:  losartan (COZAAR) 100 MG tablet, Take 1 tablet by mouth daily., Disp: , Rfl:  ondansetron (ZOFRAN) 8 MG tablet, Take 1 tablet (8 mg total) by mouth every 6 (six) hours as needed for nausea., Disp: 20 tablet, Rfl: 0 acetaminophen (TYLENOL) 650 MG CR tablet, Take 1,300 mg by mouth every 8 (eight) hours as needed (headache). Reported on 08/01/2015, Disp: , Rfl:  cyclobenzaprine (FLEXERIL) 10 MG tablet, Take one every 8 hours for muscle cramps (Patient not taking: Reported on 08/01/2015), Disp: 20 tablet, Rfl: 0 ibuprofen (ADVIL,MOTRIN) 800 MG tablet, Take 800 mg by mouth every 8 (eight) hours as needed for headache. Reported on 08/01/2015, Disp: , Rfl:   No current facility-administered medications for this visit.    Blood pressure 120/80, pulse 76, weight 250 lb (113.399 kg), last menstrual period 04/21/2012.  Physical Exam: Incision with some bruising no evidence of infection intact Abdomen soft normal post op   Results for orders placed or performed during the  hospital encounter of 07/31/15 (from the past 24 hour(s))  CBC with Differential/Platelet     Status: None   Collection Time: 07/31/15  5:25 PM  Result Value Ref Range   WBC 8.2 4.0 - 10.5 K/uL   RBC 4.17 3.87 - 5.11 MIL/uL   Hemoglobin 13.3 12.0 - 15.0 g/dL   HCT 37.9 36.0 - 46.0 %   MCV 90.9 78.0 - 100.0 fL   MCH 31.9 26.0 - 34.0 pg   MCHC 35.1 30.0 - 36.0 g/dL   RDW 12.6 11.5 - 15.5 %   Platelets 284 150 - 400 K/uL   Neutrophils Relative % 70 %   Neutro Abs 5.8 1.7 - 7.7 K/uL   Lymphocytes Relative 19 %   Lymphs Abs 1.5 0.7 - 4.0 K/uL   Monocytes Relative 8 %   Monocytes Absolute 0.6 0.1 - 1.0 K/uL   Eosinophils Relative 3 %   Eosinophils Absolute 0.2 0.0 - 0.7 K/uL   Basophils Relative 0 %   Basophils Absolute 0.0 0.0 - 0.1 K/uL  Comprehensive metabolic panel     Status: Abnormal   Collection Time: 07/31/15  5:25 PM  Result Value Ref Range   Sodium 136 135 - 145 mmol/L   Potassium 3.5 3.5 - 5.1 mmol/L   Chloride 102 101 - 111 mmol/L   CO2 26 22 - 32 mmol/L   Glucose, Bld 97 65 - 99 mg/dL   BUN 11 6 - 20 mg/dL   Creatinine, Ser 0.87 0.44 - 1.00 mg/dL   Calcium 9.3 8.9 - 10.3 mg/dL   Total Protein 7.2 6.5 - 8.1  g/dL   Albumin 3.6 3.5 - 5.0 g/dL   AST 23 15 - 41 U/L   ALT 23 14 - 54 U/L   Alkaline Phosphatase 54 38 - 126 U/L   Total Bilirubin 1.3 (H) 0.3 - 1.2 mg/dL   GFR calc non Af Amer >60 >60 mL/min   GFR calc Af Amer >60 >60 mL/min   Anion gap 8 5 - 15   Diagnostic Tests: Sonogram of both calves are normal  Pathology: benign  Impression: S/p supracervical hysterectomy  Plan: Follow up 4 weeks for final post p visit  Follow up: 4  weeks  Florian Buff, MD

## 2015-08-03 ENCOUNTER — Encounter (HOSPITAL_COMMUNITY): Payer: Self-pay | Admitting: Obstetrics & Gynecology

## 2015-08-28 DIAGNOSIS — Z029 Encounter for administrative examinations, unspecified: Secondary | ICD-10-CM

## 2015-08-29 ENCOUNTER — Encounter: Payer: Self-pay | Admitting: Obstetrics & Gynecology

## 2015-08-29 ENCOUNTER — Ambulatory Visit (INDEPENDENT_AMBULATORY_CARE_PROVIDER_SITE_OTHER): Payer: 59 | Admitting: Obstetrics & Gynecology

## 2015-08-29 VITALS — BP 120/90 | HR 64 | Ht 64.5 in | Wt 247.0 lb

## 2015-08-29 DIAGNOSIS — Z9889 Other specified postprocedural states: Secondary | ICD-10-CM

## 2015-08-29 NOTE — Progress Notes (Signed)
  HPI: Patient returns for routine postoperative follow-up having undergone supracervical hysterectomy [facility]  on 07/26/2015.  The patient's immediate postoperative recovery has been unremarkable. Since hospital discharge the patient reports occasional wave of nausea.   Current Outpatient Prescriptions: acetaminophen (TYLENOL) 650 MG CR tablet, Take 1,300 mg by mouth every 8 (eight) hours as needed (headache). Reported on 08/01/2015, Disp: , Rfl:  ALPRAZolam (XANAX) 0.5 MG tablet, Take 1 tablet by mouth 3 (three) times daily as needed for anxiety. , Disp: , Rfl:  cholecalciferol (VITAMIN D) 1000 UNITS tablet, Take 1,000 Units by mouth daily., Disp: , Rfl:  cyclobenzaprine (FLEXERIL) 10 MG tablet, Take one every 8 hours for muscle cramps, Disp: 20 tablet, Rfl: 0 escitalopram (LEXAPRO) 10 MG tablet, Take 1 tablet by mouth daily., Disp: , Rfl:  fish oil-omega-3 fatty acids 1000 MG capsule, Take 1 g by mouth daily. Reported on 07/18/2015, Disp: , Rfl:  hydrochlorothiazide (HYDRODIURIL) 25 MG tablet, Take 1 tablet by mouth daily., Disp: , Rfl:  ibuprofen (ADVIL,MOTRIN) 800 MG tablet, Take 800 mg by mouth every 8 (eight) hours as needed for headache. Reported on 08/01/2015, Disp: , Rfl:  losartan (COZAAR) 100 MG tablet, Take 1 tablet by mouth daily., Disp: , Rfl:  ondansetron (ZOFRAN) 8 MG tablet, Take 1 tablet (8 mg total) by mouth every 6 (six) hours as needed for nausea., Disp: 20 tablet, Rfl: 0  No current facility-administered medications for this visit.     Blood pressure 120/90, pulse 64, height 5' 4.5" (1.638 m), weight 247 lb (112 kg), last menstrual period 04/21/2012.  Physical Exam: Incision clean dry intact Vagina intact with cervix  Diagnostic Tests: none  Pathology: benign  Impression: Normal post op course s/p supracervical hsyt BSO  Plan:   Follow up: 1  years  Florian Buff, MD

## 2015-12-05 ENCOUNTER — Other Ambulatory Visit (HOSPITAL_COMMUNITY): Payer: Self-pay | Admitting: Family Medicine

## 2015-12-05 DIAGNOSIS — Z1231 Encounter for screening mammogram for malignant neoplasm of breast: Secondary | ICD-10-CM

## 2015-12-25 ENCOUNTER — Ambulatory Visit (HOSPITAL_COMMUNITY): Payer: 59

## 2016-01-03 ENCOUNTER — Ambulatory Visit (HOSPITAL_COMMUNITY)
Admission: RE | Admit: 2016-01-03 | Discharge: 2016-01-03 | Disposition: A | Discharge status: Home / Self Care | Payer: 59 | Source: Ambulatory Visit | Attending: Family Medicine | Admitting: Family Medicine

## 2016-01-03 DIAGNOSIS — Z1231 Encounter for screening mammogram for malignant neoplasm of breast: Secondary | ICD-10-CM | POA: Insufficient documentation

## 2016-05-31 DIAGNOSIS — M2042 Other hammer toe(s) (acquired), left foot: Secondary | ICD-10-CM | POA: Diagnosis not present

## 2016-05-31 DIAGNOSIS — R5383 Other fatigue: Secondary | ICD-10-CM | POA: Diagnosis not present

## 2016-06-05 ENCOUNTER — Encounter: Payer: Self-pay | Admitting: *Deleted

## 2016-07-31 ENCOUNTER — Other Ambulatory Visit (HOSPITAL_COMMUNITY): Payer: Self-pay | Admitting: Internal Medicine

## 2016-07-31 ENCOUNTER — Ambulatory Visit (HOSPITAL_COMMUNITY)
Admission: RE | Admit: 2016-07-31 | Discharge: 2016-07-31 | Disposition: A | Discharge status: Home / Self Care | Payer: 59 | Source: Ambulatory Visit | Attending: Internal Medicine | Admitting: Internal Medicine

## 2016-07-31 DIAGNOSIS — S92511A Displaced fracture of proximal phalanx of right lesser toe(s), initial encounter for closed fracture: Secondary | ICD-10-CM | POA: Insufficient documentation

## 2016-07-31 DIAGNOSIS — S99921A Unspecified injury of right foot, initial encounter: Secondary | ICD-10-CM

## 2016-07-31 DIAGNOSIS — X58XXXA Exposure to other specified factors, initial encounter: Secondary | ICD-10-CM | POA: Diagnosis not present

## 2016-07-31 DIAGNOSIS — S90121A Contusion of right lesser toe(s) without damage to nail, initial encounter: Secondary | ICD-10-CM | POA: Diagnosis not present

## 2016-09-18 DIAGNOSIS — Z1389 Encounter for screening for other disorder: Secondary | ICD-10-CM | POA: Diagnosis not present

## 2016-09-18 DIAGNOSIS — I10 Essential (primary) hypertension: Secondary | ICD-10-CM | POA: Diagnosis not present

## 2016-09-18 DIAGNOSIS — M545 Low back pain: Secondary | ICD-10-CM | POA: Diagnosis not present

## 2016-10-25 ENCOUNTER — Other Ambulatory Visit (HOSPITAL_COMMUNITY): Payer: Self-pay | Admitting: Family Medicine

## 2016-10-25 DIAGNOSIS — Z1231 Encounter for screening mammogram for malignant neoplasm of breast: Secondary | ICD-10-CM

## 2017-09-10 ENCOUNTER — Other Ambulatory Visit (HOSPITAL_COMMUNITY): Payer: Self-pay | Admitting: Family Medicine

## 2017-09-10 DIAGNOSIS — Z1231 Encounter for screening mammogram for malignant neoplasm of breast: Secondary | ICD-10-CM

## 2017-09-15 ENCOUNTER — Ambulatory Visit (HOSPITAL_COMMUNITY)
Admission: RE | Admit: 2017-09-15 | Discharge: 2017-09-15 | Disposition: A | Discharge status: Home / Self Care | Payer: 59 | Source: Ambulatory Visit | Attending: Family Medicine | Admitting: Family Medicine

## 2017-09-15 DIAGNOSIS — Z1231 Encounter for screening mammogram for malignant neoplasm of breast: Secondary | ICD-10-CM

## 2017-09-23 DIAGNOSIS — E538 Deficiency of other specified B group vitamins: Secondary | ICD-10-CM | POA: Diagnosis not present

## 2017-09-23 DIAGNOSIS — R5383 Other fatigue: Secondary | ICD-10-CM | POA: Diagnosis not present

## 2017-09-23 DIAGNOSIS — Z1389 Encounter for screening for other disorder: Secondary | ICD-10-CM | POA: Diagnosis not present

## 2017-09-23 DIAGNOSIS — G4730 Sleep apnea, unspecified: Secondary | ICD-10-CM | POA: Diagnosis not present

## 2017-10-13 DIAGNOSIS — E042 Nontoxic multinodular goiter: Secondary | ICD-10-CM | POA: Diagnosis not present

## 2017-10-13 DIAGNOSIS — R221 Localized swelling, mass and lump, neck: Secondary | ICD-10-CM | POA: Diagnosis not present

## 2018-03-21 ENCOUNTER — Emergency Department: Payer: 59

## 2018-03-21 ENCOUNTER — Emergency Department
Admission: EM | Admit: 2018-03-21 | Discharge: 2018-03-21 | Disposition: A | Discharge status: Home / Self Care | Payer: 59 | Attending: Emergency Medicine | Admitting: Emergency Medicine

## 2018-03-21 ENCOUNTER — Other Ambulatory Visit: Payer: Self-pay

## 2018-03-21 ENCOUNTER — Encounter: Payer: Self-pay | Admitting: Emergency Medicine

## 2018-03-21 DIAGNOSIS — Y9289 Other specified places as the place of occurrence of the external cause: Secondary | ICD-10-CM | POA: Diagnosis not present

## 2018-03-21 DIAGNOSIS — X500XXA Overexertion from strenuous movement or load, initial encounter: Secondary | ICD-10-CM | POA: Insufficient documentation

## 2018-03-21 DIAGNOSIS — Y998 Other external cause status: Secondary | ICD-10-CM | POA: Diagnosis not present

## 2018-03-21 DIAGNOSIS — I10 Essential (primary) hypertension: Secondary | ICD-10-CM | POA: Diagnosis not present

## 2018-03-21 DIAGNOSIS — M25562 Pain in left knee: Secondary | ICD-10-CM | POA: Diagnosis present

## 2018-03-21 DIAGNOSIS — Y9301 Activity, walking, marching and hiking: Secondary | ICD-10-CM | POA: Diagnosis not present

## 2018-03-21 DIAGNOSIS — E785 Hyperlipidemia, unspecified: Secondary | ICD-10-CM | POA: Insufficient documentation

## 2018-03-21 DIAGNOSIS — M25462 Effusion, left knee: Secondary | ICD-10-CM

## 2018-03-21 DIAGNOSIS — M79605 Pain in left leg: Secondary | ICD-10-CM | POA: Insufficient documentation

## 2018-03-21 DIAGNOSIS — Z87891 Personal history of nicotine dependence: Secondary | ICD-10-CM | POA: Insufficient documentation

## 2018-03-21 DIAGNOSIS — Z79899 Other long term (current) drug therapy: Secondary | ICD-10-CM | POA: Insufficient documentation

## 2018-03-21 DIAGNOSIS — S8992XA Unspecified injury of left lower leg, initial encounter: Secondary | ICD-10-CM

## 2018-03-21 MED ORDER — KETOROLAC TROMETHAMINE 30 MG/ML IJ SOLN
30.0000 mg | Freq: Once | INTRAMUSCULAR | Status: AC
Start: 2018-03-21 — End: 2018-03-21
  Administered 2018-03-21: 30 mg via INTRAMUSCULAR
  Filled 2018-03-21: qty 1

## 2018-03-21 MED ORDER — HYDROCODONE-ACETAMINOPHEN 5-325 MG PO TABS
1.0000 | ORAL_TABLET | Freq: Once | ORAL | Status: AC
  Administered 2018-03-21: 1 via ORAL

## 2018-03-21 MED ORDER — IBUPROFEN 600 MG PO TABS: 600.0000 mg | ORAL_TABLET | Freq: Four times a day (QID) | ORAL | 0 refills | Status: DC | PRN

## 2018-03-21 MED ORDER — HYDROCODONE-ACETAMINOPHEN 5-325 MG PO TABS: 1.0000 | ORAL_TABLET | ORAL | 0 refills | Status: AC | PRN

## 2018-03-21 NOTE — ED Notes (Addendum)
Pt states this is a workers comp case at this time.  Edison Nasuti NA aware

## 2018-03-21 NOTE — ED Triage Notes (Signed)
Twisted L knee at work approx 0730 today. Painful since.

## 2018-03-21 NOTE — Discharge Instructions (Signed)
Please wear knee immobilizer and use crutches until seen by orthopedics.  Please elevate leg today and tonight.  Apply ice frequently throughout the day. Please call orthopedics on Monday morning for an appointment this week.  You can take ibuprofen and Vicodin for pain.

## 2018-03-21 NOTE — ED Notes (Signed)
Pt returned from US at this time.

## 2018-03-21 NOTE — ED Provider Notes (Signed)
[facility] Emergency Department Provider Note  ____________________________________________  Time seen: Approximately 11:12 AM  I have reviewed the triage vital signs and the nursing notes.   HISTORY  Chief Complaint Knee Pain    HPI Brittany Daniels is a 53 y.o. female that presents to the emergency department for evaluation of knee pain. Patient stepped wrong at work this morning, knee twisted, and immediately started having pain to her right knee. Pain is primarily to the front. She states that entire leg feels swollen and tight now. No additional injuries. No fever, numbness, tingling.   Past Medical History:  Diagnosis Date  . Anxiety   . Hyperlipidemia   . Hypertension   . Migraine    last migraine was 2 yrs ago  . PONV (postoperative nausea and vomiting)     Patient Active Problem List   Diagnosis Date Noted  . S/P abdominal supracervical subtotal hysterectomy 07/26/2015  . Hyperlipidemia 04/30/2012  . Anxiety 04/30/2012    Past Surgical History:  Procedure Laterality Date  . ABDOMINAL HYSTERECTOMY    . DILITATION & CURRETTAGE/HYSTROSCOPY WITH THERMACHOICE ABLATION N/A 06/10/2012   Procedure: DILATATION & CURETTAGE/HYSTEROSCOPY WITH THERMACHOICE ABLATION (TOTAL THERAPY TIME=90min19sec) ;  Surgeon: Florian Buff, MD;  Location: [facility];  Service: Gynecology;  Laterality: N/A;  . HOLMIUM LASER APPLICATION N/A 2/54/2706   Procedure: HOLMIUM LASER REMOVAL OF SUBMUCOSAL MYOMA;  Surgeon: Florian Buff, MD;  Location: [facility];  Service: Gynecology;  Laterality: N/A;  . molar abstraction    . SALPINGOOPHORECTOMY Bilateral 07/26/2015   Procedure: BILATERAL SALPINGO OOPHORECTOMY;  Surgeon: Florian Buff, MD;  Location: [facility];  Service: Gynecology;  Laterality: Bilateral;  . SINUS SURGERY WITH INSTATRAK    . SUPRACERVICAL ABDOMINAL HYSTERECTOMY N/A 07/26/2015   Procedure: HYSTERECTOMY SUPRACERVICAL ABDOMINAL;  Surgeon: Florian Buff, MD;  Location: [facility];   Service: Gynecology;  Laterality: N/A;  . TONSILECTOMY, ADENOIDECTOMY, BILATERAL MYRINGOTOMY AND TUBES     pt says only had tonsils out, no adenoidectomy or myringotomy.  . TONSILLECTOMY      Prior to Admission medications   Medication Sig Start Date End Date Taking? Authorizing Provider  acetaminophen (TYLENOL) 650 MG CR tablet Take 1,300 mg by mouth every 8 (eight) hours as needed (headache). Reported on 08/01/2015    [provider]  ALPRAZolam Duanne Moron) 0.5 MG tablet Take 1 tablet by mouth 3 (three) times daily as needed for anxiety.  05/09/15   [provider]  cholecalciferol (VITAMIN D) 1000 UNITS tablet Take 1,000 Units by mouth daily.    [provider]  cyclobenzaprine (FLEXERIL) 10 MG tablet Take one every 8 hours for muscle cramps 07/31/15   Milton Ferguson, MD  escitalopram (LEXAPRO) 10 MG tablet Take 1 tablet by mouth daily. 05/08/15   [provider]  fish oil-omega-3 fatty acids 1000 MG capsule Take 1 g by mouth daily. Reported on 07/18/2015    [provider]  hydrochlorothiazide (HYDRODIURIL) 25 MG tablet Take 1 tablet by mouth daily. 06/05/15   [provider]  HYDROcodone-acetaminophen (NORCO/VICODIN) 5-325 MG tablet Take 1 tablet by mouth every 4 (four) hours as needed for up to 3 days for moderate pain. 03/21/18 03/24/18  Laban Emperor, PA-C  ibuprofen (ADVIL,MOTRIN) 600 MG tablet Take 1 tablet (600 mg total) by mouth every 6 (six) hours as needed. 03/21/18   Laban Emperor, PA-C  losartan (COZAAR) 100 MG tablet Take 1 tablet by mouth daily. 06/05/15   [provider]  ondansetron (  ZOFRAN) 8 MG tablet Take 1 tablet (8 mg total) by mouth every 6 (six) hours as needed for nausea. 07/27/15   Florian Buff, MD    Allergies Ace inhibitors and Oxycodone hcl  Family History  Problem Relation Age of Onset  . Hypertension Mother   . Diabetes Father   . Cancer Maternal Grandmother        breast cancer  . Cancer Maternal  Grandfather        skin cancer    Social History Social History   Tobacco Use  . Smoking status: Former Smoker    Packs/day: 0.25    Years: 1.00    Pack years: 0.25    Types: Cigarettes    Last attempt to quit: 07/20/1984    Years since quitting: 33.6  . Smokeless tobacco: Never Used  Substance Use Topics  . Alcohol use: No  . Drug use: No     Review of Systems  Constitutional: No fever/chills. Gastrointestinal: No nausea, no vomiting.  Musculoskeletal: Negative for knee pain.  Skin: Negative for rash, abrasions, lacerations, ecchymosis. Neurological: Negative for headaches, numbness or tingling   ____________________________________________   PHYSICAL EXAM:  VITAL SIGNS: ED Triage Vitals  Enc Vitals Group     BP 03/21/18 0948 116/90     Pulse Rate 03/21/18 0948 86     Resp 03/21/18 0948 20     Temp 03/21/18 0948 98 F (36.7 C)     Temp Source 03/21/18 0948 Oral     SpO2 03/21/18 0948 99 %     Weight 03/21/18 0951 230 lb (104.3 kg)     Height 03/21/18 0951 5' 4.5" (1.638 m)     Head Circumference --      Peak Flow --      Pain Score 03/21/18 0951 10     Pain Loc --      Pain Edu? --      Excl. in [place]? --      Constitutional: Alert and oriented. Well appearing and in no acute distress. Eyes: Conjunctivae are normal. PERRL. EOMI. Head: Atraumatic. ENT:      Ears:      Nose: No congestion/rhinnorhea.      Mouth/Throat: Mucous membranes are moist.  Neck: No stridor. Cardiovascular: Normal rate, regular rhythm.  Good peripheral circulation. Respiratory: Normal respiratory effort without tachypnea or retractions. Lungs CTAB. Good air entry to the bases with no decreased or absent breath sounds. Musculoskeletal: Full range of motion to all extremities. No gross deformities appreciated. Swelling to left knee. Diffuse tenderness to palpation. Limited ROM due to pain.  Neurologic:  Normal speech and language. No gross focal neurologic deficits are appreciated.   Skin:  Skin is warm, dry and intact. No rash noted. Psychiatric: Mood and affect are normal. Speech and behavior are normal. Patient exhibits appropriate insight and judgement.   ____________________________________________   LABS (all labs ordered are listed, but only abnormal results are displayed)  Labs Reviewed - No data to display ____________________________________________  EKG   ____________________________________________  RADIOLOGY Robinette Haines, personally viewed and evaluated these images (plain radiographs) as part of my medical decision making, as well as reviewing the written report by the radiologist.  US Venous Img Lower Unilateral Left  Result Date: 03/21/2018 CLINICAL DATA:  Left leg pain and swelling EXAM: LEFT LOWER EXTREMITY VENOUS DOPPLER ULTRASOUND TECHNIQUE: Gray-scale sonography with graded compression, as well as color Doppler and duplex ultrasound were performed to evaluate the lower extremity deep venous  systems from the level of the common femoral vein and including the common femoral, femoral, profunda femoral, popliteal and calf veins including the posterior tibial, peroneal and gastrocnemius veins when visible. The superficial great saphenous vein was also interrogated. Spectral Doppler was utilized to evaluate flow at rest and with distal augmentation maneuvers in the common femoral, femoral and popliteal veins. COMPARISON:  None. FINDINGS: Contralateral Common Femoral Vein: Respiratory phasicity is normal and symmetric with the symptomatic side. No evidence of thrombus. Normal compressibility. Common Femoral Vein: No evidence of thrombus. Normal compressibility, respiratory phasicity and response to augmentation. Saphenofemoral Junction: No evidence of thrombus. Normal compressibility and flow on color Doppler imaging. Profunda Femoral Vein: No evidence of thrombus. Normal compressibility and flow on color Doppler imaging. Femoral Vein: No evidence of  thrombus. Normal compressibility, respiratory phasicity and response to augmentation. Popliteal Vein: No evidence of thrombus. Normal compressibility, respiratory phasicity and response to augmentation. Calf Veins: No evidence of thrombus. Normal compressibility and flow on color Doppler imaging. Superficial Great Saphenous Vein: No evidence of thrombus. Normal compressibility. Venous Reflux:  None. Other Findings:  None. IMPRESSION: No evidence of deep venous thrombosis. Electronically Signed   By: Inez Catalina M.D.   On: 03/21/2018 11:27   Dg Knee Complete 4 Views Left  Result Date: 03/21/2018 CLINICAL DATA:  Acute LEFT knee pain following twisting injury today. Initial encounter. EXAM: LEFT KNEE - COMPLETE 4+ VIEW COMPARISON:  None. FINDINGS: No acute fracture or dislocation identified. A moderate to large knee effusion is present. No focal bony lesions are present. IMPRESSION: Moderate to large knee effusion.  No acute bony abnormality. Electronically Signed   By: Margarette Canada M.D.   On: 03/21/2018 11:56    ____________________________________________    PROCEDURES  Procedure(s) performed:    Procedures    Medications  HYDROcodone-acetaminophen (NORCO/VICODIN) 5-325 MG per tablet 1 tablet (1 tablet Oral Given 03/21/18 1040)  ketorolac (TORADOL) 30 MG/ML injection 30 mg (30 mg Intramuscular Given 03/21/18 1220)     ____________________________________________   INITIAL IMPRESSION / ASSESSMENT AND PLAN / ED COURSE  Pertinent labs & imaging results that were available during my care of the patient were reviewed by me and considered in my medical decision making (see chart for details).  Review of the [place] CSRS was performed in accordance of the [place] prior to dispensing any controlled drugs.   Patient's diagnosis is consistent with knee effusion. Vital signs and exam are reassuring. Knee xray consistent with joint effusion and no fracture. Knee immobilizer was placed. Crutches were  given. Patient will be discharged home with prescriptions for norco, ibuprofen. Patient is to follow up with ortho as directed. Patient is given ED precautions to return to the ED for any worsening or new symptoms.     ____________________________________________  FINAL CLINICAL IMPRESSION(S) / ED DIAGNOSES  Final diagnoses:  Effusion of left knee  Injury of left knee, initial encounter      NEW MEDICATIONS STARTED DURING THIS VISIT:  ED Discharge Orders         Ordered    HYDROcodone-acetaminophen (NORCO/VICODIN) 5-325 MG tablet  Every 4 hours PRN     03/21/18 1229    ibuprofen (ADVIL,MOTRIN) 600 MG tablet  Every 6 hours PRN     03/21/18 1229              This chart was dictated using voice recognition software/Dragon. Despite best efforts to proofread, errors can occur which can change the meaning. Any change was purely unintentional.  Laban Emperor, PA-C 03/21/18 1445    Lavonia Drafts, MD 03/21/18 [phone]

## 2018-04-02 DIAGNOSIS — S86912A Strain of unspecified muscle(s) and tendon(s) at lower leg level, left leg, initial encounter: Secondary | ICD-10-CM | POA: Diagnosis not present

## 2018-04-02 DIAGNOSIS — M25562 Pain in left knee: Secondary | ICD-10-CM | POA: Diagnosis not present

## 2018-04-24 DIAGNOSIS — S86912D Strain of unspecified muscle(s) and tendon(s) at lower leg level, left leg, subsequent encounter: Secondary | ICD-10-CM | POA: Diagnosis not present

## 2019-08-26 ENCOUNTER — Ambulatory Visit: Payer: 59 | Admitting: Podiatry

## 2019-08-26 ENCOUNTER — Other Ambulatory Visit: Payer: Self-pay

## 2019-08-26 ENCOUNTER — Encounter: Payer: Self-pay | Admitting: Podiatry

## 2019-08-26 ENCOUNTER — Other Ambulatory Visit: Payer: Self-pay | Admitting: Podiatry

## 2019-08-26 ENCOUNTER — Ambulatory Visit (INDEPENDENT_AMBULATORY_CARE_PROVIDER_SITE_OTHER): Payer: 59

## 2019-08-26 DIAGNOSIS — Q66221 Congenital metatarsus adductus, right foot: Secondary | ICD-10-CM

## 2019-08-26 DIAGNOSIS — Q66222 Congenital metatarsus adductus, left foot: Secondary | ICD-10-CM | POA: Diagnosis not present

## 2019-08-26 DIAGNOSIS — M79671 Pain in right foot: Secondary | ICD-10-CM

## 2019-08-26 DIAGNOSIS — M79672 Pain in left foot: Secondary | ICD-10-CM | POA: Diagnosis not present

## 2019-08-26 DIAGNOSIS — M21612 Bunion of left foot: Secondary | ICD-10-CM

## 2019-08-26 DIAGNOSIS — M21611 Bunion of right foot: Secondary | ICD-10-CM

## 2019-08-26 NOTE — Progress Notes (Signed)
Subjective:   Patient ID: Brittany Daniels, female   DOB: 54 y.o.   MRN: [id number]   HPI Patient presents with severe bunion deformity left over right foot and also states she gets generalized foot pain in her midfoot and into her arch.  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  Patient does not smoke and would like to be active   Review of Systems  All other systems reviewed and are negative.       Objective:  Physical Exam Vitals and nursing note reviewed.  Constitutional:      Appearance: She is well-developed.  Pulmonary:     Effort: Pulmonary effort is normal.  Musculoskeletal:        General: Normal range of motion.  Skin:    General: Skin is warm.  Neurological:     Mental Status: She is alert.     Neurovascular status intact muscle strength found to be adequate range of motion within normal limits.  Patient does have moderate collapse medial longitudinal arch bilateral no equinus condition is noted and has severe bunion deformity left over right with prominence of the first metatarsal head and redness pain around the joint surface.  Patient has good digital perfusion well oriented x3     Assessment:  Severe HAV deformity left over right with probable metatarsus adductus underlying deformity and moderate flatfoot deformity     Plan:  H&P reviewed condition and x-rays at great length.  I do think this will require Lapidus fusion along with probable metatarsocuneiform fusion of two three and possibly four.  I reviewed this with Jamayah and discussed this condition and reviewed x-rays with her.  At this point I am referring this patient to Dr. Sherryle Lis for surgery and I reviewed the case with him and were both in agreement.  She will have this done beginning of September and  will reappoint in the next couple weeks for consultation  X-rays indicate severe bunion deformity left over right with also what appears to be arthritis and metatarsus adductus deformity of the second and third possibly fourth metatarsal.  Moderate flatfoot deformity noted on x-ray upon full weightbearing with plantar heel spur bilateral

## 2019-08-26 NOTE — Patient Instructions (Signed)
Bunion  A bunion is a bump on the base of the big toe that forms when the bones of the big toe joint move out of position. Bunions may be small at first, but they often get larger over time. They can make walking painful. What are the causes? A bunion may be caused by:  Wearing narrow or pointed shoes that force the big toe to press against the other toes.  Abnormal foot development that causes the foot to roll inward (pronate).  Changes in the foot that are caused by certain diseases, such as rheumatoid arthritis or polio.  A foot injury. What increases the risk? The following factors may make you more likely to develop this condition:  Wearing shoes that squeeze the toes together.  Having certain diseases, such as: ? Rheumatoid arthritis. ? Polio. ? Cerebral palsy.  Having family members who have bunions.  Being born with a foot deformity, such as flat feet or low arches.  Doing activities that put a lot of pressure on the feet, such as ballet dancing. What are the signs or symptoms? The main symptom of a bunion is a noticeable bump on the big toe. Other symptoms may include:  Pain.  Swelling around the big toe.  Redness and inflammation.  Thick or hardened skin on the big toe or between the toes.  Stiffness or loss of motion in the big toe.  Trouble with walking. How is this diagnosed? A bunion may be diagnosed based on your symptoms, medical history, and activities. You may have tests, such as:  X-rays. These allow your health care provider to check the position of the bones in your foot and look for damage to your joint. They also help your health care provider determine the severity of your bunion and the best way to treat it.  Joint aspiration. In this test, a sample of fluid is removed from the toe joint. This test may be done if you are in a lot of pain. It helps rule out diseases that cause painful swelling of the joints, such as arthritis. How is this  treated? Treatment depends on the severity of your symptoms. The goal of treatment is to relieve symptoms and prevent the bunion from getting worse. Your health care provider may recommend:  Wearing shoes that have a wide toe box.  Using bunion pads to cushion the affected area.  Taping your toes together to keep them in a normal position.  Placing a device inside your shoe (orthotics) to help reduce pressure on your toe joint.  Taking medicine to ease pain, inflammation, and swelling.  Applying heat or ice to the affected area.  Doing stretching exercises.  Surgery to remove scar tissue and move the toes back into their normal position. This treatment is rare. Follow these instructions at home: Managing pain, stiffness, and swelling   If directed, put ice on the painful area: ? Put ice in a plastic bag. ? Place a towel between your skin and the bag. ? Leave the ice on for 20 minutes, 2-3 times a day. Activity   If directed, apply heat to the affected area before you exercise. Use the heat source that your health care provider recommends, such as a moist heat pack or a heating pad. ? Place a towel between your skin and the heat source. ? Leave the heat on for 20-30 minutes. ? Remove the heat if your skin turns bright red. This is especially important if you are unable to feel pain,   heat, or cold. You may have a greater risk of getting burned.  Do exercises as told by your health care provider. General instructions  Support your toe joint with proper footwear, shoe padding, or taping as told by your health care provider.  Take over-the-counter and prescription medicines only as told by your health care provider.  Keep all follow-up visits as told by your health care provider. This is important. Contact a health care provider if your symptoms:  Get worse.  Do not improve in 2 weeks. Get help right away if you have:  Severe pain and trouble with walking. Summary  A  bunion is a bump on the base of the big toe that forms when the bones of the big toe joint move out of position.  Bunions can make walking painful.  Treatment depends on the severity of your symptoms.  Support your toe joint with proper footwear, shoe padding, or taping as told by your health care provider. This information is not intended to replace advice given to you by your health care provider. Make sure you discuss any questions you have with your health care provider. Document Revised: 07/14/2017 Document Reviewed: 05/20/2017 Elsevier Patient Education  2020 [employer] Inc[employer]  

## 2019-08-27 ENCOUNTER — Telehealth: Payer: Self-pay | Admitting: *Deleted

## 2019-08-27 DIAGNOSIS — M19072 Primary osteoarthritis, left ankle and foot: Secondary | ICD-10-CM

## 2019-08-27 DIAGNOSIS — Q66222 Congenital metatarsus adductus, left foot: Secondary | ICD-10-CM

## 2019-08-27 DIAGNOSIS — M21611 Bunion of right foot: Secondary | ICD-10-CM

## 2019-08-27 DIAGNOSIS — M79672 Pain in left foot: Secondary | ICD-10-CM

## 2019-08-27 DIAGNOSIS — M21612 Bunion of left foot: Secondary | ICD-10-CM

## 2019-08-27 DIAGNOSIS — Q66221 Congenital metatarsus adductus, right foot: Secondary | ICD-10-CM

## 2019-08-27 DIAGNOSIS — M79671 Pain in right foot: Secondary | ICD-10-CM

## 2019-08-27 NOTE — Telephone Encounter (Signed)
-----   Message from Criselda Peaches, DPM sent at 08/26/2019  4:52 PM EDT ----- Regarding: CT left foot Can you order a non contrast CT of the left foot? Diagnosis is left foot osteoarthritis with attention to the tarsometatarsal joints.  Dr Paulla Dolly saw her today and I'll be seeing her in a few weeks to plan for surgery in September.  Thanks! Quita Skye

## 2019-08-27 NOTE — Telephone Encounter (Signed)
Faxed orders and clinicals to  [facility] for pre-cert and scheduling. 

## 2019-09-09 ENCOUNTER — Other Ambulatory Visit: Payer: Self-pay

## 2019-09-09 ENCOUNTER — Ambulatory Visit
Admission: RE | Admit: 2019-09-09 | Discharge: 2019-09-09 | Disposition: A | Discharge status: Home / Self Care | Payer: 59 | Source: Ambulatory Visit | Attending: Podiatry | Admitting: Podiatry

## 2019-09-14 NOTE — Telephone Encounter (Signed)
Would you be able to make an appt for Ms Raspberry to see me in the next couple weeks for a surgical consult? She was able to get her CT completed early.  Thanks!

## 2019-09-15 NOTE — Telephone Encounter (Signed)
Thanks

## 2019-10-07 ENCOUNTER — Other Ambulatory Visit: Payer: Self-pay

## 2019-10-07 ENCOUNTER — Ambulatory Visit: Payer: 59 | Admitting: Podiatry

## 2019-10-07 DIAGNOSIS — M21612 Bunion of left foot: Secondary | ICD-10-CM

## 2019-10-07 DIAGNOSIS — M79671 Pain in right foot: Secondary | ICD-10-CM

## 2019-10-07 DIAGNOSIS — M19072 Primary osteoarthritis, left ankle and foot: Secondary | ICD-10-CM

## 2019-10-07 DIAGNOSIS — M21611 Bunion of right foot: Secondary | ICD-10-CM

## 2019-10-07 DIAGNOSIS — Q66221 Congenital metatarsus adductus, right foot: Secondary | ICD-10-CM | POA: Diagnosis not present

## 2019-10-07 DIAGNOSIS — M79672 Pain in left foot: Secondary | ICD-10-CM

## 2019-10-07 DIAGNOSIS — Q66222 Congenital metatarsus adductus, left foot: Secondary | ICD-10-CM

## 2019-10-09 NOTE — Progress Notes (Signed)
Subjective:  Patient ID: Brittany Daniels, female    DOB: [birth year],  MRN: [id number]  Chief Complaint  Patient presents with  . Consult    surgery consult left foot bunion - Dr Paulla Dolly referral    54 y.o. female presents with the above complaint. History confirmed with patient.  She has painful bunions on both feet but the left is the worst.  Most of her pain is at the bump over the bunion as well as the dorsal midfoot.  Certain walking and when in shoe gears.  She was referred to me by Dr. Paulla Dolly for surgical intervention.  She completed a CT scan prior to today's visit  Objective:  Physical Exam: warm, good capillary refill, no trophic changes or ulcerative lesions, normal DP and PT pulses and normal sensory exam. Severe hallux valgus with pes planus and metatarsus adductus deformity bilaterally.  Range of motion of the first MTPJ is intact.  There is hypermobility of the first tarsometatarsal joint bilaterally.  Pain on palpation of the dorsal midfoot  EXAM: CT OF THE LEFT FOOT WITHOUT CONTRAST  TECHNIQUE: Multidetector CT imaging of the left foot was performed according to the standard protocol. Multiplanar CT image reconstructions were also generated.  COMPARISON:  Left foot x-rays dated August 26, 2019.  FINDINGS: Bones/Joint/Cartilage  No fracture or dislocation. Moderate hallux valgus deformity. Moderate to severe second and third TMT joint space narrowing with marginal osteophytes and subchondral cysts. The ankle mortise is symmetric. The talar dome is intact. No joint effusion. Plantar and Achilles enthesophytes. Bipartite medial hallux sesamoid.  Ligaments  Ligaments are suboptimally evaluated by CT.  Muscles and Tendons Grossly intact.  No significant muscle atrophy.  Soft tissue No fluid collection or hematoma.  No soft tissue mass.  IMPRESSION: 1. Moderate to severe second and third TMT joint osteoarthritis. 2. Moderate hallux valgus  deformity.   Electronically Signed   By: Titus Dubin M.D.   On: 09/09/2019 17:43 Assessment:   1. Pain in both feet   2. Bilateral bunions   3. Metatarsus adductus of both feet   4. Osteoarthritis of left ankle and foot      Plan:  Patient was evaluated and treated and all questions answered.  Discussed the etiology and treatment including surgical and non surgical treatment for painful bunions, we discussed that her bunions are not straightforward as it is complicated by metatarsus adductus deformity as well as osteoarthritis of the second third tarsometatarsal joints.  She has exhausted all non surgical treatment prior to this visit including shoe gear changes and padding.  She desires surgical intervention. We discussed all risks including but not limited to: pain, swelling, infection, scar, numbness which may be temporary or permanent, chronic pain, stiffness, nerve pain or damage, wound healing problems, bone healing problems including delayed or non-union and recurrence. Specifically we discussed the following procedures: Lapidus bunionectomy with arthrodesis of the second and third tarsometatarsal joints. Informed consent was signed today. Surgery will be scheduled at a mutually agreeable date. Information regarding this will be forwarded to our surgery scheduler.   Surgical plan:  Procedure: -Lapidus bunionectomy with arthrodesis of second and third tarsometatarsal joints left foot  Location: -[facility]  Anesthesia plan: -IV sedation with regional block  Postoperative pain plan: -Tylenol 1000 mg every 6 hours, ibuprofen 600 mg every 6 hours, gabapentin 300 mg every 8 hours x5 days, oxycodone 5 mg 1-2 tabs every 6 hours only as needed  DVT prophylaxis: -Xarelto 10 mg nightly  WB Restrictions /  DME needs: -NWB in posterior splint to be applied in operating room   No follow-ups on file.

## 2019-10-25 ENCOUNTER — Telehealth: Payer: Self-pay

## 2019-10-25 NOTE — Telephone Encounter (Signed)
DOS 10/29/2019  LAPIDUS PROCEDURE INCLUDING BUNIONECTOMY LT - 28297 FUSION TARSOMETATARSAL JOINT 2,3 LT - [place] EFFECTIVE DATE - 01/22/2019  PLAN DEDUCTIBLE - $1750.00 W/ $1750.00 REMAINING OUT OF POCKET - $6000.00 W/ $6060.04 REMAINING COPAY $0.00 COINSURANCE - 20%  PER AUTOMATED SYSTEM CPT 28297 & 28730 AUTH WAS APPROVED ON 10/21/2019.  AUTH # [id number] GOOD FROM 10/29/2019 - 01/27/2020

## 2019-10-29 ENCOUNTER — Other Ambulatory Visit: Payer: Self-pay | Admitting: Podiatry

## 2019-10-29 DIAGNOSIS — M2012 Hallux valgus (acquired), left foot: Secondary | ICD-10-CM

## 2019-10-29 DIAGNOSIS — M150 Primary generalized (osteo)arthritis: Secondary | ICD-10-CM

## 2019-10-29 DIAGNOSIS — Q66222 Congenital metatarsus adductus, left foot: Secondary | ICD-10-CM

## 2019-10-29 MED ORDER — RIVAROXABAN 10 MG PO TABS: 10.0000 mg | ORAL_TABLET | Freq: Every day | ORAL | 0 refills | Status: DC

## 2019-10-29 MED ORDER — HYDROCODONE-ACETAMINOPHEN 5-300 MG PO TABS: 1.0000 | ORAL_TABLET | Freq: Four times a day (QID) | ORAL | 0 refills | Status: AC | PRN

## 2019-10-29 MED ORDER — IBUPROFEN 600 MG PO TABS: 600.0000 mg | ORAL_TABLET | Freq: Four times a day (QID) | ORAL | 0 refills | Status: AC | PRN

## 2019-10-29 MED ORDER — GABAPENTIN 300 MG PO CAPS: 300.0000 mg | ORAL_CAPSULE | Freq: Three times a day (TID) | ORAL | 0 refills | Status: AC

## 2019-10-29 MED ORDER — ONDANSETRON HCL 8 MG PO TABS: 8.0000 mg | ORAL_TABLET | Freq: Four times a day (QID) | ORAL | 0 refills | Status: DC | PRN

## 2019-10-29 MED ORDER — ONDANSETRON HCL 8 MG PO TABS: 8.0000 mg | ORAL_TABLET | Freq: Two times a day (BID) | ORAL | 0 refills | Status: DC | PRN

## 2019-10-29 MED ORDER — ACETAMINOPHEN 500 MG PO TABS: 1000.0000 mg | ORAL_TABLET | Freq: Three times a day (TID) | ORAL | 0 refills | Status: AC | PRN

## 2019-10-29 NOTE — Addendum Note (Signed)
Addended bySherryle Lis, Madyn Ivins R on: 10/29/2019 03:52 PM   Modules accepted: Orders

## 2019-10-29 NOTE — Progress Notes (Signed)
10/29/19 [place] TMT fusion and Lapidus 1-3

## 2019-11-02 ENCOUNTER — Telehealth: Payer: Self-pay | Admitting: *Deleted

## 2019-11-02 NOTE — Telephone Encounter (Signed)
Patient is calling and requesting something else for pain, her surgery foot(heel) is so painful, feels like on fire,entire foot feels uncomfortable, swollen, tight as if it will burst out of it's wrappings. Please call.

## 2019-11-02 NOTE — Telephone Encounter (Signed)
I spoke with the patient and advised she can loosen the outer ACE wraps but not the inner dressing, she will be seen this Thursday at 2:30pm. Advised to increase to 10mg  every 4 hours if necessary from 5mg  q6h of oxycodone

## 2019-11-04 ENCOUNTER — Ambulatory Visit (INDEPENDENT_AMBULATORY_CARE_PROVIDER_SITE_OTHER): Payer: 59 | Admitting: Podiatry

## 2019-11-04 ENCOUNTER — Other Ambulatory Visit: Payer: Self-pay

## 2019-11-04 ENCOUNTER — Ambulatory Visit (INDEPENDENT_AMBULATORY_CARE_PROVIDER_SITE_OTHER): Payer: 59

## 2019-11-04 ENCOUNTER — Encounter: Payer: Self-pay | Admitting: Podiatry

## 2019-11-04 DIAGNOSIS — Z9889 Other specified postprocedural states: Secondary | ICD-10-CM

## 2019-11-04 DIAGNOSIS — M79672 Pain in left foot: Secondary | ICD-10-CM

## 2019-11-06 NOTE — Progress Notes (Signed)
  Subjective:  Patient ID: Brittany Daniels, female    DOB: 1965/05/24,  MRN: [id number]  Chief Complaint  Patient presents with  . Routine Post Op    PT stated that overall she is doing good she denies any pain at this time she has what looks like a big blood blister on the top of her foot     DOS: 10/29/2019 Procedure: Left foot metatarsus adductus correction, Lapidus bunionectomy, tarsometatarsal fusion 2, 3  54 y.o. female returns for post-op check.  Doing well.  The pain in the heel has improved since she loosened the Ace wrap from the splint.  Review of Systems: Negative except as noted in the HPI. Denies N/V/F/Ch.   Objective:  There were no vitals filed for this visit. There is no height or weight on file to calculate BMI. Constitutional Well developed. Well nourished.  Vascular Foot warm and well perfused. Capillary refill normal to all digits.   Neurologic Normal speech. Oriented to person, place, and time. Epicritic sensation to light touch grossly present bilaterally.  Dermatologic  incisions are well coapted with Steri-Strips in place and absorbable sutures.  Away from the incision on the dorsal lateral forefoot over the fourth and fifth metatarsals is a large hemorrhagic blister  Orthopedic: Tenderness to palpation noted about the surgical site.  Significant edema   Radiographs: Hardware intact and in place, consistent with postoperative changes Assessment:   1. Foot pain, left    Plan:  Patient was evaluated and treated and all questions answered.  S/p foot surgery left -Following sterile Betadine prep, the hemorrhagic blister was lanced and drained without incident -XR: As expected postoperative changes -WB Status: NWB in well-padded posterior splint below-knee, reapplied today -Sutures: Absorbable sutures. -Medications: No refills required today -Foot redressed with Adaptic, dry sterile gauze dressings, and Ace wrap under light compression and a well-padded  below-knee splint. - I reiterated the importance of icing and elevating to control postoperative edema  Return in 12 days (on 11/16/2019).

## 2019-11-08 ENCOUNTER — Other Ambulatory Visit: Payer: Self-pay | Admitting: Podiatry

## 2019-11-08 DIAGNOSIS — Z9889 Other specified postprocedural states: Secondary | ICD-10-CM

## 2019-11-11 ENCOUNTER — Encounter: Payer: 59 | Admitting: Podiatry

## 2019-11-12 ENCOUNTER — Encounter: Payer: 59 | Admitting: Podiatry

## 2019-11-16 ENCOUNTER — Encounter: Payer: 59 | Admitting: Podiatry

## 2019-11-16 ENCOUNTER — Other Ambulatory Visit: Payer: Self-pay

## 2019-11-16 ENCOUNTER — Ambulatory Visit (INDEPENDENT_AMBULATORY_CARE_PROVIDER_SITE_OTHER): Payer: 59 | Admitting: Podiatry

## 2019-11-16 DIAGNOSIS — Q66221 Congenital metatarsus adductus, right foot: Secondary | ICD-10-CM

## 2019-11-16 DIAGNOSIS — T148XXA Other injury of unspecified body region, initial encounter: Secondary | ICD-10-CM

## 2019-11-16 DIAGNOSIS — Z9889 Other specified postprocedural states: Secondary | ICD-10-CM

## 2019-11-16 DIAGNOSIS — M19072 Primary osteoarthritis, left ankle and foot: Secondary | ICD-10-CM

## 2019-11-16 DIAGNOSIS — M2012 Hallux valgus (acquired), left foot: Secondary | ICD-10-CM

## 2019-11-16 DIAGNOSIS — Q66222 Congenital metatarsus adductus, left foot: Secondary | ICD-10-CM

## 2019-11-16 DIAGNOSIS — M21612 Bunion of left foot: Secondary | ICD-10-CM

## 2019-11-19 LAB — WOUND CULTURE
MICRO NUMBER:: 11120273
RESULT:: NO GROWTH
SPECIMEN QUALITY:: ADEQUATE

## 2019-11-21 NOTE — Progress Notes (Signed)
°  Subjective:  Patient ID: Brittany Daniels, female    DOB: Dec 05, 1965,  MRN: [id number]  Chief Complaint  Patient presents with   Routine Post Op    PT stated that she is doing good and has no concerns and denies any pain at this time     DOS: 10/29/2019 Procedure: Left foot metatarsus adductus correction, Lapidus bunionectomy, tarsometatarsal fusion 2, 3  54 y.o. female returns for post-op check #2.  Doing well.  Minimal pain today.  Review of Systems: Negative except as noted in the HPI. Denies N/V/F/Ch.   Objective:  There were no vitals filed for this visit. There is no height or weight on file to calculate BMI. Constitutional Well developed. Well nourished.  Vascular Foot warm and well perfused. Capillary refill normal to all digits.   Neurologic Normal speech. Oriented to person, place, and time. Epicritic sensation to light touch grossly present bilaterally.  Dermatologic  incisions are well coapted with Steri-Strips in place and absorbable sutures.  The previously drained hemorrhagic blister has returned and is firm to touch.  Orthopedic: Tenderness to palpation noted about the surgical site.  Significant edema   Radiographs: Hardware intact and in place, consistent with postoperative changes Assessment:   1. Post-operative state   2. Hematoma of skin   3. Hallux valgus with bunions, left   4. Metatarsus adductus of both feet   5. Osteoarthritis of left ankle and foot    Plan:  Patient was evaluated and treated and all questions answered.  S/p foot surgery left -Following sterile Betadine prep, the hemorrhagic blister was lanced, a small area laterally had coagulated blood, this was cultured. The remainder was congealed and removed easily to reveal healthy skin underneath, no open wound is present -XR: New XR next visit -WB Status: NWB in CAM boot -Sutures: Absorbable sutures. -Medications: No refills required today -May begin light bathing, no soaking - I  reiterated the importance of icing and elevating to control postoperative edema  Return in about 3 weeks (around 12/07/2019) for for new x-rays.

## 2019-11-23 ENCOUNTER — Telehealth: Payer: Self-pay

## 2019-11-23 NOTE — Telephone Encounter (Signed)
The pt called and stated that the compression sock felt too tight on her foot. I asked the pt was her foot warm to the touch and was she able to move her toes and she stated yes. According to Dr. Sherryle Lis notes he wants the pt to NWB in CAM boot and light bathing but no soaking. I did reiterate that its importance to ice and elevate to help with the edema as well. So I told the pt is was ok to remove the compression as she is icing and elevating foot. I did advise her to call if she had additional questions.

## 2019-11-25 ENCOUNTER — Encounter: Payer: 59 | Admitting: Podiatry

## 2019-12-07 ENCOUNTER — Ambulatory Visit (INDEPENDENT_AMBULATORY_CARE_PROVIDER_SITE_OTHER): Payer: 59 | Admitting: Podiatry

## 2019-12-07 ENCOUNTER — Ambulatory Visit (INDEPENDENT_AMBULATORY_CARE_PROVIDER_SITE_OTHER): Payer: 59

## 2019-12-07 ENCOUNTER — Encounter: Payer: Self-pay | Admitting: Podiatry

## 2019-12-07 ENCOUNTER — Other Ambulatory Visit: Payer: Self-pay

## 2019-12-07 DIAGNOSIS — M21612 Bunion of left foot: Secondary | ICD-10-CM

## 2019-12-07 DIAGNOSIS — M2012 Hallux valgus (acquired), left foot: Secondary | ICD-10-CM

## 2019-12-07 MED ORDER — MUPIROCIN 2 % EX OINT: 1.0000 "application " | TOPICAL_OINTMENT | Freq: Every day | CUTANEOUS | 0 refills | Status: AC

## 2019-12-07 NOTE — Patient Instructions (Signed)
Continue icing and elevating the foot  Apply the mupirocin ointment daily to the incisions

## 2019-12-07 NOTE — Progress Notes (Signed)
  Subjective:  Patient ID: Brittany Daniels, female    DOB: 18-Mar-1965,  MRN: [id number]  Chief Complaint  Patient presents with  . Routine Post Op    POV#3 Pt states occasional discomfort and some nausea. Denies fever/chills/vomiting.    DOS: 10/29/2019 Procedure: Left foot metatarsus adductus correction, Lapidus bunionectomy, tarsometatarsal fusion 2, 3  54 y.o. female returns for post-op check #2.  Doing well.  Minimal pain today.  She is still been NWB in the CAM boot  Review of Systems: Negative except as noted in the HPI. Denies N/V/F/Ch.   Objective:  There were no vitals filed for this visit. There is no height or weight on file to calculate BMI. Constitutional Well developed. Well nourished.  Vascular Foot warm and well perfused. Capillary refill normal to all digits.   Neurologic Normal speech. Oriented to person, place, and time. Epicritic sensation to light touch grossly present bilaterally.  Dermatologic  incisions are well coapted with dried scab over the incisions  Orthopedic: Tenderness to palpation noted about the surgical site.  Significant edema   Radiographs: Hardware intact and unchanged, IM angle is reduced from preop's and second and third metatarsals adductus has been reduced appropriately, mild hallux abduction  Assessment:   1. Hallux valgus with bunions, left    Plan:  Patient was evaluated and treated and all questions answered.  S/p foot surgery left -She is doing quite well.  She may begin weightbearing as tolerated in the CAM boot.  I discussed with her that she still has some increase in the IM angle but overall the metatarsus adductus has been reduced nicely, she may have persistent hallux abduction and possible recurrence of the bunion in the future.,  Will see if this becomes symptomatic for her at all. -XR: New XR next visit -WB Status: WBAT in CAM boot -Continue use of compression sleeve and hallux spacer -Continue regular bathing.  She  has small scabs over the incisions and she states that she had bumped the medial incision.  I prescribed her mupirocin ointment she will apply daily until healed.  Return in about 4 weeks (around 01/04/2020) for new xray at next visit, move to shoe.

## 2020-01-06 ENCOUNTER — Ambulatory Visit (INDEPENDENT_AMBULATORY_CARE_PROVIDER_SITE_OTHER): Payer: 59 | Admitting: Podiatry

## 2020-01-06 ENCOUNTER — Other Ambulatory Visit: Payer: Self-pay

## 2020-01-06 ENCOUNTER — Ambulatory Visit (INDEPENDENT_AMBULATORY_CARE_PROVIDER_SITE_OTHER): Payer: 59

## 2020-01-06 DIAGNOSIS — M21612 Bunion of left foot: Secondary | ICD-10-CM | POA: Diagnosis not present

## 2020-01-06 DIAGNOSIS — M2012 Hallux valgus (acquired), left foot: Secondary | ICD-10-CM | POA: Diagnosis not present

## 2020-01-06 NOTE — Progress Notes (Signed)
  Subjective:  Patient ID: Brittany Daniels, female    DOB: 06-14-1965,  MRN: [id number]  Chief Complaint  Patient presents with  . Routine Post Op    POV #4 DOS 10/29/2019 LAPIDUS PROCEDURE INCLUDING BUNIONECTOMY LT; FUSION OF 2ND & 3RD TARSOMETATARSAL JOINTS LT      DOS: 10/29/2019 Procedure: Left foot metatarsus adductus correction, Lapidus bunionectomy, tarsometatarsal fusion 2, 3  54 y.o. female returns for post-op check #4  Doing well.  Minimal pain today.  Has been WBAT in the CAM boot.  She feels well having no pain.  Review of Systems: Negative except as noted in the HPI. Denies N/V/F/Ch.   Objective:  There were no vitals filed for this visit. There is no height or weight on file to calculate BMI. Constitutional Well developed. Well nourished.  Vascular Foot warm and well perfused. Capillary refill normal to all digits.   Neurologic Normal speech. Oriented to person, place, and time. Epicritic sensation to light touch grossly present bilaterally.  Dermatologic  well-healed surgical incisions  Orthopedic:  No pain today on the surgical site.  Mild to moderate edema   Radiographs: Hardware intact and unchanged, IM angle is reduced from preop's and second and third metatarsals adductus has been reduced appropriately, mild hallux abduction.  Good union of arthrodesis sites  Assessment:   1. Hallux valgus with bunions, left    Plan:  Patient was evaluated and treated and all questions answered.  S/p foot surgery left -She is doing quite well.  She may begin weightbearing as tolerated in regular shoe gear.  I discussed with her that she still has some increase in the IM angle but overall the metatarsus adductus has been reduced nicely, she may have persistent hallux abduction and possible recurrence of the bunion in the future.,  Will see if this becomes symptomatic for her at all. -XR: New XR next visit -WB Status: WBAT in regular shoes -Continue use of compression sleeve     Return in about 4 months (around 05/06/2020) for check left foot, plan surgery on right foot.

## 2020-01-08 ENCOUNTER — Encounter: Payer: Self-pay | Admitting: Podiatry

## 2020-11-15 ENCOUNTER — Encounter (HOSPITAL_COMMUNITY): Payer: Self-pay

## 2020-11-15 ENCOUNTER — Other Ambulatory Visit: Payer: Self-pay

## 2020-11-15 ENCOUNTER — Emergency Department (HOSPITAL_COMMUNITY): Payer: 59

## 2020-11-15 ENCOUNTER — Emergency Department (HOSPITAL_COMMUNITY)
Admission: EM | Admit: 2020-11-15 | Discharge: 2020-11-15 | Disposition: A | Discharge status: Home / Self Care | Payer: 59 | Attending: Emergency Medicine | Admitting: Emergency Medicine

## 2020-11-15 DIAGNOSIS — E860 Dehydration: Secondary | ICD-10-CM

## 2020-11-15 DIAGNOSIS — Z79899 Other long term (current) drug therapy: Secondary | ICD-10-CM | POA: Insufficient documentation

## 2020-11-15 DIAGNOSIS — Z7901 Long term (current) use of anticoagulants: Secondary | ICD-10-CM | POA: Diagnosis not present

## 2020-11-15 DIAGNOSIS — R7989 Other specified abnormal findings of blood chemistry: Secondary | ICD-10-CM | POA: Diagnosis not present

## 2020-11-15 DIAGNOSIS — U071 COVID-19: Secondary | ICD-10-CM | POA: Diagnosis not present

## 2020-11-15 DIAGNOSIS — I10 Essential (primary) hypertension: Secondary | ICD-10-CM | POA: Insufficient documentation

## 2020-11-15 DIAGNOSIS — Z87891 Personal history of nicotine dependence: Secondary | ICD-10-CM | POA: Insufficient documentation

## 2020-11-15 DIAGNOSIS — R112 Nausea with vomiting, unspecified: Secondary | ICD-10-CM | POA: Diagnosis present

## 2020-11-15 LAB — BASIC METABOLIC PANEL
Anion gap: 12 (ref 5–15)
BUN: 19 mg/dL (ref 6–20)
CO2: 25 mmol/L (ref 22–32)
Calcium: 9.8 mg/dL (ref 8.9–10.3)
Chloride: 97 mmol/L — ABNORMAL LOW (ref 98–111)
Creatinine, Ser: 2.1 mg/dL — ABNORMAL HIGH (ref 0.44–1.00)
GFR, Estimated: 27 mL/min — ABNORMAL LOW (ref >60)
Glucose, Bld: 137 mg/dL — ABNORMAL HIGH (ref 70–99)
Potassium: 3.3 mmol/L — ABNORMAL LOW (ref 3.5–5.1)
Sodium: 134 mmol/L — ABNORMAL LOW (ref 135–145)

## 2020-11-15 LAB — CBC
HCT: 48.6 % — ABNORMAL HIGH (ref 36.0–46.0)
Hemoglobin: 16.1 g/dL — ABNORMAL HIGH (ref 12.0–15.0)
MCH: 30.5 pg (ref 26.0–34.0)
MCHC: 33.1 g/dL (ref 30.0–36.0)
MCV: 92 fL (ref 80.0–100.0)
Platelets: 233 10*3/uL (ref 150–400)
RBC: 5.28 MIL/uL — ABNORMAL HIGH (ref 3.87–5.11)
RDW: 12.8 % (ref 11.5–15.5)
WBC: 4.8 10*3/uL (ref 4.0–10.5)
nRBC: 0 % (ref 0.0–0.2)

## 2020-11-15 LAB — TROPONIN I (HIGH SENSITIVITY)
Troponin I (High Sensitivity): 3 ng/L (ref <18)
Troponin I (High Sensitivity): 3 ng/L (ref <18)

## 2020-11-15 LAB — RESP PANEL BY RT-PCR (FLU A&B, COVID) ARPGX2
Influenza A by PCR: NEGATIVE
Influenza B by PCR: NEGATIVE
SARS Coronavirus 2 by RT PCR: POSITIVE — AB

## 2020-11-15 LAB — LACTIC ACID, PLASMA: Lactic Acid, Venous: 1.4 mmol/L (ref 0.5–1.9)

## 2020-11-15 MED ORDER — KETOROLAC TROMETHAMINE 30 MG/ML IJ SOLN
30.0000 mg | Freq: Once | INTRAMUSCULAR | Status: AC
  Administered 2020-11-15: 30 mg via INTRAVENOUS
  Filled 2020-11-15: qty 1

## 2020-11-15 MED ORDER — ONDANSETRON HCL 4 MG PO TABS: 4.0000 mg | ORAL_TABLET | Freq: Three times a day (TID) | ORAL | 0 refills | Status: AC | PRN

## 2020-11-15 MED ORDER — ONDANSETRON HCL 4 MG/2ML IJ SOLN
4.0000 mg | Freq: Once | INTRAMUSCULAR | Status: AC
  Administered 2020-11-15: 4 mg via INTRAVENOUS
  Filled 2020-11-15: qty 2

## 2020-11-15 MED ORDER — NIRMATRELVIR/RITONAVIR (PAXLOVID) TABLET (RENAL DOSING): 2.0000 | ORAL_TABLET | Freq: Two times a day (BID) | ORAL | 0 refills | Status: DC

## 2020-11-15 MED ORDER — SODIUM CHLORIDE 0.9 % IV BOLUS
1000.0000 mL | Freq: Once | INTRAVENOUS | Status: AC
  Administered 2020-11-15: 1000 mL via INTRAVENOUS

## 2020-11-15 MED ORDER — NIRMATRELVIR/RITONAVIR (PAXLOVID) TABLET (RENAL DOSING): 2.0000 | ORAL_TABLET | Freq: Two times a day (BID) | ORAL | 0 refills | Status: AC

## 2020-11-15 MED ORDER — POTASSIUM CHLORIDE CRYS ER 20 MEQ PO TBCR
40.0000 meq | EXTENDED_RELEASE_TABLET | Freq: Once | ORAL | Status: AC
  Administered 2020-11-15: 40 meq via ORAL
  Filled 2020-11-15: qty 2

## 2020-11-15 NOTE — ED Notes (Signed)
US at bedside

## 2020-11-15 NOTE — Discharge Instructions (Addendum)
You have been seen and discharged from the emergency department.  You are COVID-positive.  Your blood work found that you were dehydrated.  You are given IV fluids here.  It is very important that you continue to orally rehydrate with clear liquid/water.  Take nausea medicine as needed.  Take Paxil that prescription as directed.  Follow-up with your primary provider in the next 2 to 3 days for repeat blood work, for reevaluation and further care. Take home medications as prescribed. If you have any worsening symptoms inability to drink, lower back pain, signs of dehydration or further concerns for your health please return to an emergency department for further evaluation.

## 2020-11-15 NOTE — ED Notes (Signed)
Reminded pt I needed a urine sample advised to call when she was ready to go

## 2020-11-15 NOTE — ED Triage Notes (Signed)
Pt arrived to ED with c/o chest pain and SOB since yesterday. Last Tues began benicar.

## 2020-11-15 NOTE — ED Notes (Signed)
Reminded pt still needed a urine

## 2020-11-15 NOTE — ED Provider Notes (Signed)
[facility] EMERGENCY DEPARTMENT Provider Note   CSN: [id number] Arrival date & time: 11/15/20  0757     History Chief Complaint  Patient presents with   Chest Pain    Brittany Daniels is a 55 y.o. female.  HPI  55 year old female past medical history of HTN, HLD, migraine presents the emergency department with concern for fatigue, nausea and vomiting. This morning she experienced chest heaviness and shortness of breath which is what prompted evaluation.  Patient is a vague historian.  Patient states this has been going on since last night. Felt unwell waking up this morning so came in for evaluation. The chest pressure has been intermittent, related to heaving.  She has lower back pain that has extends bilaterally.   Denies any cough, genitourinary symptoms, no diarrhea.  States that she feels extremely fatigued.  Past Medical History:  Diagnosis Date   Anxiety    Hyperlipidemia    Hypertension    Migraine    last migraine was 2 yrs ago   PONV (postoperative nausea and vomiting)     Patient Active Problem List   Diagnosis Date Noted   S/P abdominal supracervical subtotal hysterectomy 07/26/2015   Hyperlipidemia 04/30/2012   Anxiety 04/30/2012    Past Surgical History:  Procedure Laterality Date   ABDOMINAL HYSTERECTOMY     DILITATION & CURRETTAGE/HYSTROSCOPY WITH THERMACHOICE ABLATION N/A 06/10/2012   Procedure: DILATATION & CURETTAGE/HYSTEROSCOPY WITH THERMACHOICE ABLATION (TOTAL THERAPY TIME=69min19sec) ;  Surgeon: Florian Buff, MD;  Location: [facility];  Service: Gynecology;  Laterality: N/A;   HOLMIUM LASER APPLICATION N/A 5/91/6384   Procedure: HOLMIUM LASER REMOVAL OF SUBMUCOSAL MYOMA;  Surgeon: Florian Buff, MD;  Location: [facility];  Service: Gynecology;  Laterality: N/A;   molar abstraction     SALPINGOOPHORECTOMY Bilateral 07/26/2015   Procedure: BILATERAL SALPINGO OOPHORECTOMY;  Surgeon: Florian Buff, MD;  Location: [facility];  Service: Gynecology;  Laterality:  Bilateral;   SINUS SURGERY WITH INSTATRAK     SUPRACERVICAL ABDOMINAL HYSTERECTOMY N/A 07/26/2015   Procedure: HYSTERECTOMY SUPRACERVICAL ABDOMINAL;  Surgeon: Florian Buff, MD;  Location: [facility];  Service: Gynecology;  Laterality: N/A;   TONSILECTOMY, ADENOIDECTOMY, BILATERAL MYRINGOTOMY AND TUBES     pt says only had tonsils out, no adenoidectomy or myringotomy.   TONSILLECTOMY       OB History   No obstetric history on file.     Family History  Problem Relation Age of Onset   Hypertension Mother    Diabetes Father    Cancer Maternal Grandmother        breast cancer   Cancer Maternal Grandfather        skin cancer    Social History   Tobacco Use   Smoking status: Former    Packs/day: 0.25    Years: 1.00    Pack years: 0.25    Types: Cigarettes    Quit date: 07/20/1984    Years since quitting: 36.3   Smokeless tobacco: Never  Substance Use Topics   Alcohol use: No   Drug use: No    Home Medications Prior to Admission medications   Medication Sig Start Date End Date Taking? Authorizing Provider  ALPRAZolam Duanne Moron) 0.5 MG tablet Take 1 tablet by mouth 3 (three) times daily as needed for anxiety.  05/09/15  Yes [provider]  cholecalciferol (VITAMIN D) 1000 UNITS tablet Take 1,000 Units by mouth daily.   Yes [provider]  fish oil-omega-3 fatty acids 1000 MG capsule Take  1 g by mouth daily. Reported on 07/18/2015   Yes [provider]  losartan (COZAAR) 100 MG tablet Take 1 tablet by mouth daily. 06/05/15  Yes [provider]  olmesartan-hydrochlorothiazide (BENICAR HCT) 40-25 MG tablet Take 1 tablet by mouth daily. 10/31/20  Yes [provider]  phentermine (ADIPEX-P) 37.5 MG tablet Take 37.5 mg by mouth every morning. 11/01/20  Yes [provider]  cyclobenzaprine (FLEXERIL) 10 MG tablet Take one every 8 hours for muscle cramps Patient not taking: No sig reported 07/31/15   Milton Ferguson, MD  gabapentin  (NEURONTIN) 300 MG capsule Take 1 capsule (300 mg total) by mouth 3 (three) times daily for 7 days. Patient not taking: Reported on 11/15/2020 10/29/19 11/05/19  Criselda Peaches, DPM  hydrochlorothiazide (HYDRODIURIL) 25 MG tablet Take 1 tablet by mouth daily. Patient not taking: Reported on 11/15/2020 06/05/15   [provider]  mupirocin ointment (BACTROBAN) 2 % Apply 1 application topically daily. Patient not taking: No sig reported 12/07/19   Criselda Peaches, DPM  ondansetron (ZOFRAN) 8 MG tablet Take 1 tablet (8 mg total) by mouth every 12 (twelve) hours as needed for nausea or vomiting. Patient not taking: No sig reported 10/29/19   Criselda Peaches, DPM  rivaroxaban (XARELTO) 10 MG TABS tablet Take 1 tablet (10 mg total) by mouth daily. Patient not taking: No sig reported 10/29/19   Criselda Peaches, DPM    Allergies    Ace inhibitors and Oxycodone hcl  Review of Systems   Review of Systems  Constitutional:  Positive for appetite change, chills and fatigue. Negative for fever.  HENT:  Negative for congestion.   Eyes:  Negative for visual disturbance.  Respiratory:  Positive for chest tightness and shortness of breath.   Cardiovascular:  Negative for chest pain.  Gastrointestinal:  Negative for abdominal pain, diarrhea and vomiting.  Genitourinary:  Negative for dysuria and flank pain.  Musculoskeletal:  Positive for back pain.  Skin:  Negative for rash.  Neurological:  Negative for headaches.   Physical Exam Updated Vital Signs BP (!) 61/53   Pulse 87   Temp 97.8 F (36.6 C) (Oral)   Resp 14   Ht 5\' 4"  (1.626 m)   Wt 117.8 kg   LMP 04/21/2012   SpO2 98%   BMI 44.56 kg/m   Physical Exam  ED Results / Procedures / Treatments   Labs (all labs ordered are listed, but only abnormal results are displayed) Labs Reviewed  BASIC METABOLIC PANEL - Abnormal; Notable for the following components:      Result Value   Sodium 134 (*)    Potassium 3.3 (*)    Chloride  97 (*)    Glucose, Bld 137 (*)    Creatinine, Ser 2.10 (*)    GFR, Estimated 27 (*)    All other components within normal limits  CBC - Abnormal; Notable for the following components:   RBC 5.28 (*)    Hemoglobin 16.1 (*)    HCT 48.6 (*)    All other components within normal limits  CULTURE, BLOOD (ROUTINE X 2)  CULTURE, BLOOD (ROUTINE X 2)  RESP PANEL BY RT-PCR (FLU A&B, COVID) ARPGX2  LACTIC ACID, PLASMA  LACTIC ACID, PLASMA  URINALYSIS, ROUTINE W REFLEX MICROSCOPIC  POC URINE PREG, ED  TROPONIN I (HIGH SENSITIVITY)  TROPONIN I (HIGH SENSITIVITY)    EKG EKG Interpretation  Date/Time:  Wednesday November 15 2020 08:13:40 EDT Ventricular Rate:  92 PR Interval:  149 QRS Duration: 96 QT Interval:  384 QTC Calculation: 475 R Axis:   19 Text Interpretation: Sinus rhythm Borderline T abnormalities, anterior leads NSR Confirmed by Lavenia Atlas [phone]) on 11/15/2020 8:36:02 AM  Radiology DG Chest 2 View  Result Date: 11/15/2020 CLINICAL DATA:  Chest pain EXAM: CHEST - 2 VIEW COMPARISON:  11/20/2009 FINDINGS: The heart size and mediastinal contours are within normal limits. No focal airspace consolidation, pleural effusion, or pneumothorax. The visualized skeletal structures are unremarkable. IMPRESSION: No active cardiopulmonary disease. Electronically Signed   By: Davina Poke D.O.   On: 11/15/2020 09:21    Procedures Procedures   Medications Ordered in ED Medications  sodium chloride 0.9 % bolus 1,000 mL (has no administration in time range)  ondansetron (ZOFRAN) injection 4 mg (has no administration in time range)  ketorolac (TORADOL) 30 MG/ML injection 30 mg (has no administration in time range)  sodium chloride 0.9 % bolus 1,000 mL (1,000 mLs Intravenous New Bag/Given 11/15/20 9604)    ED Course  I have reviewed the triage vital signs and the nursing notes.  Pertinent labs & imaging results that were available during my care of the patient were reviewed by me  and considered in my medical decision making (see chart for details).    MDM Rules/Calculators/A&P                           55 year old female presents emergency department with nausea/vomiting and general unwell feeling.  She was hypotensive on arrival with systolics in the 54U.  There was 1 reading with systolics in the 98J but on reevaluation and readjustment blood pressure systolics have remained 19-147.  She is fluid responsive.  Vitals are otherwise stable.  Blood work shows mild kidney dysfunction with a creatinine at 2.10.  BUN normal.  There is mild hyponatremia/hypokalemia but otherwise no acute electrolyte abnormalities.  She has no leukocytosis, lactic is normal.  COVID swab is positive.  Cardiac work-up is negative.  Ultrasound of the abdomen shows no hydronephrosis or gallbladder abnormality.  Patient was given 2 L of IV fluids and then allowed to orally hydrate.  On reevaluation she is normotensive.  Sitting up.  Improved color, states the nausea is completely resolved.  She was able to take oral potassium and drink freely.  Discussed with the patient her abnormal creatinine.  Offered admission however she feels much improved and is motivated to go home with oral rehydration and outpatient repeat labs.  Discussed return to ED precautions with her including worsening condition, continued nausea or vomiting, lower back pain or signs of dehydration.  She understands.  Patient will be sent home with Zofran and renal dosed PACs lipid after consulting with pharmacy.  Patient at this time appears safe and stable for discharge and will be treated as an outpatient.  Discharge plan and strict return to ED precautions discussed, patient verbalizes understanding and agreement.  Final Clinical Impression(s) / ED Diagnoses Final diagnoses:  None    Rx / DC Orders ED Discharge Orders     None        Lorelle Gibbs, DO 11/15/20 1522

## 2020-11-18 ENCOUNTER — Encounter (HOSPITAL_COMMUNITY): Payer: Self-pay | Admitting: *Deleted

## 2020-11-18 ENCOUNTER — Emergency Department (HOSPITAL_COMMUNITY)
Admission: EM | Admit: 2020-11-18 | Discharge: 2020-11-18 | Disposition: A | Discharge status: Home / Self Care | Payer: 59 | Attending: Emergency Medicine | Admitting: Emergency Medicine

## 2020-11-18 ENCOUNTER — Emergency Department (HOSPITAL_COMMUNITY): Payer: 59

## 2020-11-18 ENCOUNTER — Other Ambulatory Visit: Payer: Self-pay

## 2020-11-18 DIAGNOSIS — U071 COVID-19: Secondary | ICD-10-CM

## 2020-11-18 DIAGNOSIS — I10 Essential (primary) hypertension: Secondary | ICD-10-CM | POA: Diagnosis not present

## 2020-11-18 DIAGNOSIS — E860 Dehydration: Secondary | ICD-10-CM | POA: Diagnosis not present

## 2020-11-18 DIAGNOSIS — Z87891 Personal history of nicotine dependence: Secondary | ICD-10-CM | POA: Insufficient documentation

## 2020-11-18 DIAGNOSIS — R110 Nausea: Secondary | ICD-10-CM

## 2020-11-18 DIAGNOSIS — R112 Nausea with vomiting, unspecified: Secondary | ICD-10-CM | POA: Diagnosis present

## 2020-11-18 LAB — COMPREHENSIVE METABOLIC PANEL
ALT: 16 U/L (ref 0–44)
AST: 20 U/L (ref 15–41)
Albumin: 3.6 g/dL (ref 3.5–5.0)
Alkaline Phosphatase: 42 U/L (ref 38–126)
Anion gap: 9 (ref 5–15)
BUN: 17 mg/dL (ref 6–20)
CO2: 28 mmol/L (ref 22–32)
Calcium: 10.3 mg/dL (ref 8.9–10.3)
Chloride: 98 mmol/L (ref 98–111)
Creatinine, Ser: 1.84 mg/dL — ABNORMAL HIGH (ref 0.44–1.00)
GFR, Estimated: 32 mL/min — ABNORMAL LOW (ref >60)
Glucose, Bld: 105 mg/dL — ABNORMAL HIGH (ref 70–99)
Potassium: 3.7 mmol/L (ref 3.5–5.1)
Sodium: 135 mmol/L (ref 135–145)
Total Bilirubin: 0.8 mg/dL (ref 0.3–1.2)
Total Protein: 7.1 g/dL (ref 6.5–8.1)

## 2020-11-18 LAB — URINALYSIS, ROUTINE W REFLEX MICROSCOPIC
Bacteria, UA: NONE SEEN
Bilirubin Urine: NEGATIVE
Glucose, UA: NEGATIVE mg/dL
Ketones, ur: 5 mg/dL — AB
Leukocytes,Ua: NEGATIVE
Nitrite: NEGATIVE
Protein, ur: NEGATIVE mg/dL
Specific Gravity, Urine: 1.011 (ref 1.005–1.030)
pH: 5 (ref 5.0–8.0)

## 2020-11-18 LAB — CBC WITH DIFFERENTIAL/PLATELET
Abs Immature Granulocytes: 0.01 10*3/uL (ref 0.00–0.07)
Basophils Absolute: 0 10*3/uL (ref 0.0–0.1)
Basophils Relative: 1 %
Eosinophils Absolute: 0 10*3/uL (ref 0.0–0.5)
Eosinophils Relative: 1 %
HCT: 44.1 % (ref 36.0–46.0)
Hemoglobin: 14.8 g/dL (ref 12.0–15.0)
Immature Granulocytes: 0 %
Lymphocytes Relative: 27 %
Lymphs Abs: 1.1 10*3/uL (ref 0.7–4.0)
MCH: 30.6 pg (ref 26.0–34.0)
MCHC: 33.6 g/dL (ref 30.0–36.0)
MCV: 91.1 fL (ref 80.0–100.0)
Monocytes Absolute: 0.3 10*3/uL (ref 0.1–1.0)
Monocytes Relative: 8 %
Neutro Abs: 2.5 10*3/uL (ref 1.7–7.7)
Neutrophils Relative %: 63 %
Platelets: 228 10*3/uL (ref 150–400)
RBC: 4.84 MIL/uL (ref 3.87–5.11)
RDW: 12.3 % (ref 11.5–15.5)
WBC: 4 10*3/uL (ref 4.0–10.5)
nRBC: 0 % (ref 0.0–0.2)

## 2020-11-18 LAB — LIPASE, BLOOD: Lipase: 20 U/L (ref 11–51)

## 2020-11-18 LAB — CK: Total CK: 57 U/L (ref 38–234)

## 2020-11-18 MED ORDER — LORAZEPAM 2 MG/ML IJ SOLN
0.5000 mg | Freq: Once | INTRAMUSCULAR | Status: AC
  Administered 2020-11-18: 0.5 mg via INTRAVENOUS
  Filled 2020-11-18: qty 1

## 2020-11-18 MED ORDER — SODIUM CHLORIDE 0.9 % IV BOLUS
1000.0000 mL | Freq: Once | INTRAVENOUS | Status: AC
  Administered 2020-11-18: 1000 mL via INTRAVENOUS

## 2020-11-18 MED ORDER — ONDANSETRON HCL 4 MG/2ML IJ SOLN
4.0000 mg | Freq: Once | INTRAMUSCULAR | Status: AC
  Administered 2020-11-18: 4 mg via INTRAVENOUS
  Filled 2020-11-18: qty 2

## 2020-11-18 MED ORDER — SODIUM CHLORIDE 0.9 % IV SOLN
12.5000 mg | Freq: Once | INTRAVENOUS | Status: AC
  Administered 2020-11-18: 12.5 mg via INTRAVENOUS
  Filled 2020-11-18: qty 0.5

## 2020-11-18 MED ORDER — DIPHENHYDRAMINE HCL 50 MG/ML IJ SOLN
12.5000 mg | Freq: Once | INTRAMUSCULAR | Status: AC
  Administered 2020-11-18: 12.5 mg via INTRAVENOUS
  Filled 2020-11-18: qty 1

## 2020-11-18 MED ORDER — ACETAMINOPHEN 325 MG PO TABS
650.0000 mg | ORAL_TABLET | Freq: Once | ORAL | Status: AC
  Administered 2020-11-18: 650 mg via ORAL
  Filled 2020-11-18: qty 2

## 2020-11-18 MED ORDER — PROCHLORPERAZINE EDISYLATE 10 MG/2ML IJ SOLN
10.0000 mg | Freq: Once | INTRAMUSCULAR | Status: AC
  Administered 2020-11-18: 10 mg via INTRAVENOUS
  Filled 2020-11-18: qty 2

## 2020-11-18 NOTE — ED Notes (Signed)
Pt consumed 3-4 oz of sprite and was able to keep it down.

## 2020-11-18 NOTE — ED Provider Notes (Addendum)
[facility] EMERGENCY DEPARTMENT Provider Note   CSN: [id number] Arrival date & time: 11/18/20  1223     History Chief Complaint  Patient presents with   Dehydration    Evella L Avellino is a 55 y.o. female with a history including hypertension, hyperlipidemia, who is seen here 3 days ago and at that time diagnosed with COVID-19, also had dehydration and renal insufficiency.  She was resuscitated with IV fluids and Zofran and felt well and chose to be discharged home although admission was offered.  She returns today secondary to increasing weakness and nausea and vomiting, stating she has been unable to keep nearly any p.o. intake down, although did successfully consume 2 portions of Jell-O this morning.  She reports generalized fatigue, has had subjective fever with chills in hot flashes, continues to have a nonproductive cough and does report some mild shortness of breath with exertion.  She denies chest pain.  She is taking Paxlovid.  She was also prescribed Zofran which she states helps for about 4 hours with nausea and vomiting.  She denies diarrhea, denies hematemesis, positive for nausea but denies abdominal pain.  She is urinating but has slowed up somewhat.  The history is provided by the patient.      Past Medical History:  Diagnosis Date   Anxiety    Hyperlipidemia    Hypertension    Migraine    last migraine was 2 yrs ago   PONV (postoperative nausea and vomiting)     Patient Active Problem List   Diagnosis Date Noted   S/P abdominal supracervical subtotal hysterectomy 07/26/2015   Hyperlipidemia 04/30/2012   Anxiety 04/30/2012    Past Surgical History:  Procedure Laterality Date   ABDOMINAL HYSTERECTOMY     DILITATION & CURRETTAGE/HYSTROSCOPY WITH THERMACHOICE ABLATION N/A 06/10/2012   Procedure: DILATATION & CURETTAGE/HYSTEROSCOPY WITH THERMACHOICE ABLATION (TOTAL THERAPY TIME=40min19sec) ;  Surgeon: Florian Buff, MD;  Location: [facility];  Service: Gynecology;   Laterality: N/A;   HOLMIUM LASER APPLICATION N/A 1/82/9937   Procedure: HOLMIUM LASER REMOVAL OF SUBMUCOSAL MYOMA;  Surgeon: Florian Buff, MD;  Location: [facility];  Service: Gynecology;  Laterality: N/A;   molar abstraction     SALPINGOOPHORECTOMY Bilateral 07/26/2015   Procedure: BILATERAL SALPINGO OOPHORECTOMY;  Surgeon: Florian Buff, MD;  Location: [facility];  Service: Gynecology;  Laterality: Bilateral;   SINUS SURGERY WITH INSTATRAK     SUPRACERVICAL ABDOMINAL HYSTERECTOMY N/A 07/26/2015   Procedure: HYSTERECTOMY SUPRACERVICAL ABDOMINAL;  Surgeon: Florian Buff, MD;  Location: [facility];  Service: Gynecology;  Laterality: N/A;   TONSILECTOMY, ADENOIDECTOMY, BILATERAL MYRINGOTOMY AND TUBES     pt says only had tonsils out, no adenoidectomy or myringotomy.   TONSILLECTOMY       OB History   No obstetric history on file.     Family History  Problem Relation Age of Onset   Hypertension Mother    Diabetes Father    Cancer Maternal Grandmother        breast cancer   Cancer Maternal Grandfather        skin cancer    Social History   Tobacco Use   Smoking status: Former    Packs/day: 0.25    Years: 1.00    Pack years: 0.25    Types: Cigarettes    Quit date: 07/20/1984    Years since quitting: 36.3   Smokeless tobacco: Never  Substance Use Topics   Alcohol use: No   Drug use: No  Home Medications Prior to Admission medications   Medication Sig Start Date End Date Taking? Authorizing Provider  ALPRAZolam Duanne Moron) 0.5 MG tablet Take 1 tablet by mouth 3 (three) times daily as needed for anxiety.  05/09/15  Yes [provider]  cholecalciferol (VITAMIN D) 1000 UNITS tablet Take 1,000 Units by mouth daily.   Yes [provider]  fish oil-omega-3 fatty acids 1000 MG capsule Take 1 g by mouth daily. Reported on 07/18/2015   Yes [provider]  hydrochlorothiazide (HYDRODIURIL) 25 MG tablet Take 1 tablet by mouth daily. 06/05/15  Yes [provider]   losartan (COZAAR) 100 MG tablet Take 1 tablet by mouth daily. 06/05/15  Yes [provider]  nirmatrelvir/ritonavir EUA, renal dosing, (PAXLOVID) 10 x 150 MG & 10 x 100MG  TABS Take 2 tablets by mouth 2 (two) times daily for 5 days. Patient GFR is 27. Take nirmatrelvir (150 mg) one tablet twice daily for 5 days and ritonavir (100 mg) one tablet twice daily for 5 days. 11/15/20 11/20/20 Yes Horton, Alvin Critchley, DO  phentermine (ADIPEX-P) 37.5 MG tablet Take 37.5 mg by mouth every morning. 11/01/20  Yes [provider]  cyclobenzaprine (FLEXERIL) 10 MG tablet Take one every 8 hours for muscle cramps Patient not taking: No sig reported 07/31/15   Milton Ferguson, MD  gabapentin (NEURONTIN) 300 MG capsule Take 1 capsule (300 mg total) by mouth 3 (three) times daily for 7 days. Patient not taking: No sig reported 10/29/19 11/05/19  Criselda Peaches, DPM  mupirocin ointment (BACTROBAN) 2 % Apply 1 application topically daily. Patient not taking: No sig reported 12/07/19   Criselda Peaches, DPM  olmesartan-hydrochlorothiazide (BENICAR HCT) 40-25 MG tablet Take 1 tablet by mouth daily. Patient not taking: Reported on 11/18/2020 10/31/20   [provider]  ondansetron (ZOFRAN) 4 MG tablet Take 1 tablet (4 mg total) by mouth every 8 (eight) hours as needed for nausea or vomiting. Patient not taking: Reported on 11/18/2020 11/15/20   Horton, Alvin Critchley, DO    Allergies    Ace inhibitors and Oxycodone hcl  Review of Systems   Review of Systems  Constitutional:  Positive for chills and fatigue. Negative for fever.  HENT:  Negative for congestion and sore throat.   Eyes: Negative.   Respiratory:  Positive for cough and shortness of breath. Negative for chest tightness.   Cardiovascular:  Negative for chest pain.  Gastrointestinal:  Positive for nausea and vomiting. Negative for abdominal pain and diarrhea.  Genitourinary:  Positive for decreased urine volume.  Musculoskeletal:   Negative for arthralgias, joint swelling and neck pain.  Skin: Negative.  Negative for rash and wound.  Neurological:  Positive for weakness. Negative for dizziness, light-headedness, numbness and headaches.  Psychiatric/Behavioral: Negative.    All other systems reviewed and are negative.  Physical Exam Updated Vital Signs BP (!) 163/118   Pulse 81   Temp 98.3 F (36.8 C) (Oral)   Resp 14   LMP 04/21/2012   SpO2 96%   Physical Exam Vitals and nursing note reviewed.  Constitutional:      Appearance: She is well-developed.  HENT:     Head: Normocephalic and atraumatic.  Eyes:     Conjunctiva/sclera: Conjunctivae normal.  Cardiovascular:     Rate and Rhythm: Normal rate and regular rhythm.     Heart sounds: Normal heart sounds.  Pulmonary:     Effort: Pulmonary effort is normal.     Breath sounds: Normal breath sounds. No  wheezing.  Abdominal:     General: Bowel sounds are normal.     Palpations: Abdomen is soft.     Tenderness: There is no abdominal tenderness.  Musculoskeletal:        General: Normal range of motion.     Cervical back: Normal range of motion.  Skin:    General: Skin is warm and dry.  Neurological:     Mental Status: She is alert.    ED Results / Procedures / Treatments   Labs (all labs ordered are listed, but only abnormal results are displayed) Labs Reviewed  COMPREHENSIVE METABOLIC PANEL - Abnormal; Notable for the following components:      Result Value   Glucose, Bld 105 (*)    Creatinine, Ser 1.84 (*)    GFR, Estimated 32 (*)    All other components within normal limits  URINALYSIS, ROUTINE W REFLEX MICROSCOPIC - Abnormal; Notable for the following components:   Hgb urine dipstick SMALL (*)    Ketones, ur 5 (*)    All other components within normal limits  CBC WITH DIFFERENTIAL/PLATELET  LIPASE, BLOOD  CK    EKG None  Radiology DG Chest Port 1 View  Result Date: 11/18/2020 CLINICAL DATA:  Cough, shortness of breath, COVID  positive EXAM: PORTABLE CHEST 1 VIEW COMPARISON:  11/15/2020 FINDINGS: There are no signs of pulmonary edema or focal pulmonary consolidation. There is poor inspiration. There is no pleural effusion or pneumothorax. IMPRESSION: There are no signs of pulmonary edema or new focal infiltrates. Electronically Signed   By: Elmer Picker M.D.   On: 11/18/2020 14:20    Procedures Procedures   Medications Ordered in ED Medications  sodium chloride 0.9 % bolus 1,000 mL (0 mLs Intravenous Stopped 11/18/20 1555)  promethazine (PHENERGAN) 12.5 mg in sodium chloride 0.9 % 50 mL IVPB (0 mg Intravenous Stopped 11/18/20 1527)  acetaminophen (TYLENOL) tablet 650 mg (650 mg Oral Given 11/18/20 1504)  ondansetron (ZOFRAN) injection 4 mg (4 mg Intravenous Given 11/18/20 1608)  sodium chloride 0.9 % bolus 1,000 mL (0 mLs Intravenous Stopped 11/18/20 1731)  prochlorperazine (COMPAZINE) injection 10 mg (10 mg Intravenous Given 11/18/20 1738)  diphenhydrAMINE (BENADRYL) injection 12.5 mg (12.5 mg Intravenous Given 11/18/20 1741)  LORazepam (ATIVAN) injection 0.5 mg (0.5 mg Intravenous Given 11/18/20 1913)    ED Course  I have reviewed the triage vital signs and the nursing notes.  Pertinent labs & imaging results that were available during my care of the patient were reviewed by me and considered in my medical decision making (see chart for details).    MDM Rules/Calculators/A&P                           Patient presenting with probable dehydration secondary to persistent nausea and positive COVID-19 status.  She was given 2 L of IV normal saline.  She was also given Tylenol for complaint of headache, Phenergan 12.5 mg IV which did not improve her nausea.  We then tried a IV dose of Zofran which seemed to work better.  She had continued complaint of headache pain therefore was given a dose of Compazine and Benadryl, headache was improved but not resolved.  Prior to receiving the Compazine she was having  complaints of a tight restless feeling in her left leg, during her course this spread and now includes both legs and upper extremities.  She denies muscle spasm.  Ativan 0.5 mg added.  I do not  believe this represents a side effect of the Compazine since this started prior to receiving this medication.  Patient does continue to have an elevated creatinine today.  Is 1.84, however she has a normal BUN at 17 suggesting this is possibly not a picture of dehydration.  Her specific gravity is 1.011 in her urine.  She has a normal WBC count of 4.0.  A total CK was added to rule out rhabdo my lysis as reason for renal insufficiency.  Patient's total CK is normal.  Her creatinine is improved in comparison to her visit 3 days ago.  Suspect that it is even better after receiving both p.o. fluids and 2 L of normal saline.  She was given a small dose of Ativan after which her symptoms completely resolved and she felt much improved and ready for discharge home.  She did state that the Zofran she was prescribed 3 days ago was helpful but only for about 4 hours.  She was prescribed 4 mg dose tablets.  She was encouraged to increase her dose to 8 mg every 8 hours for better control of her nausea.  Other home care was discussed including rest, making sure she is staying well-hydrated.  Of note, while I was in the room a blood pressure of 163/118 was recorded, however her cuff was not on her arm at this time, therefore erroneous.  Her vital signs have been relatively stable while here.  She was advised to have a lab visit with her PCP within 1 week to ensure her creatinine is improved.   CRITICAL CARE Performed by: Evalee Jefferson Total critical care time: 35 minutes Critical care time was exclusive of separately billable procedures and treating other patients. Critical care was necessary to treat or prevent imminent or life-threatening deterioration. Critical care was time spent personally by me on the following activities:  development of treatment plan with patient and/or surrogate as well as nursing, discussions with consultants, evaluation of patient's response to treatment, examination of patient, obtaining history from patient or surrogate, ordering and performing treatments and interventions, ordering and review of laboratory studies, ordering and review of radiographic studies, pulse oximetry and re-evaluation of patient's condition.  Final Clinical Impression(s) / ED Diagnoses Final diagnoses:  COVID-19  Nausea  Dehydration    Rx / DC Orders ED Discharge Orders     None        Landis Martins 11/18/20 2002    Evalee Jefferson, PA-C 11/21/20 1601    Jeanell Sparrow, DO 11/23/20 1730

## 2020-11-18 NOTE — Discharge Instructions (Addendum)
As discussed you may increase your Zofran to 8 mg, taking 2 tablets every 8 hours for better nausea and vomiting relief.  Make sure you are drinking plenty of fluids avoid further dehydration.  Rest, you may take Tylenol if needed for fevers or body aches.  Finish the Paxlovid prescribed at your last visit here.    Schedule a lab visit with Dr. Delanna Ahmadi office for the end of this coming week to recheck your creatinine.  It is elevated today at 1.84 which is improved from your visit here 3 days ago, but will be important to know that this has returned to baseline.

## 2020-11-18 NOTE — ED Triage Notes (Signed)
Diagnosed with covid 3 days ago, states she cannot eat due to vomiting

## 2020-11-20 LAB — CULTURE, BLOOD (ROUTINE X 2)
Culture: NO GROWTH
Culture: NO GROWTH
Special Requests: ADEQUATE
Special Requests: ADEQUATE

## 2023-05-26 ENCOUNTER — Other Ambulatory Visit (HOSPITAL_COMMUNITY): Payer: Self-pay | Admitting: Family Medicine

## 2023-05-26 DIAGNOSIS — Z1231 Encounter for screening mammogram for malignant neoplasm of breast: Secondary | ICD-10-CM

## 2023-05-28 ENCOUNTER — Encounter: Payer: Self-pay | Admitting: *Deleted

## 2023-06-02 ENCOUNTER — Ambulatory Visit (HOSPITAL_COMMUNITY)

## 2023-12-01 ENCOUNTER — Encounter (INDEPENDENT_AMBULATORY_CARE_PROVIDER_SITE_OTHER): Payer: Self-pay | Admitting: *Deleted
